# Patient Record
Sex: Male | Born: 1950 | Race: White | Hispanic: No | Marital: Married | Smoking: Never smoker
Health system: Southern US, Community
[De-identification: ages and names within clinical notes are randomized; demographics above are authoritative.]

## PROBLEM LIST (undated history)

## (undated) DIAGNOSIS — E785 Hyperlipidemia, unspecified: Secondary | ICD-10-CM

## (undated) DIAGNOSIS — I2510 Atherosclerotic heart disease of native coronary artery without angina pectoris: Secondary | ICD-10-CM

## (undated) DIAGNOSIS — N400 Enlarged prostate without lower urinary tract symptoms: Secondary | ICD-10-CM

## (undated) DIAGNOSIS — I779 Disorder of arteries and arterioles, unspecified: Secondary | ICD-10-CM

## (undated) DIAGNOSIS — G4730 Sleep apnea, unspecified: Secondary | ICD-10-CM

## (undated) DIAGNOSIS — R972 Elevated prostate specific antigen [PSA]: Secondary | ICD-10-CM

## (undated) DIAGNOSIS — K219 Gastro-esophageal reflux disease without esophagitis: Secondary | ICD-10-CM

## (undated) DIAGNOSIS — G454 Transient global amnesia: Secondary | ICD-10-CM

## (undated) DIAGNOSIS — C4339 Malignant melanoma of other parts of face: Secondary | ICD-10-CM

## (undated) DIAGNOSIS — I739 Peripheral vascular disease, unspecified: Secondary | ICD-10-CM

## (undated) HISTORY — DX: Benign prostatic hyperplasia without lower urinary tract symptoms: N40.0

## (undated) HISTORY — DX: Hyperlipidemia, unspecified: E78.5

## (undated) HISTORY — DX: Elevated prostate specific antigen (PSA): R97.20

## (undated) HISTORY — DX: Disorder of arteries and arterioles, unspecified: I77.9

## (undated) HISTORY — DX: Peripheral vascular disease, unspecified: I73.9

## (undated) HISTORY — DX: Transient global amnesia: G45.4

---

## 1998-11-28 HISTORY — PX: MELANOMA EXCISION: SHX5266

## 2004-08-11 ENCOUNTER — Ambulatory Visit: Payer: Self-pay | Admitting: Family Medicine

## 2004-08-28 ENCOUNTER — Ambulatory Visit: Payer: Self-pay | Admitting: Family Medicine

## 2004-11-25 ENCOUNTER — Ambulatory Visit: Payer: Self-pay | Admitting: Family Medicine

## 2004-12-14 ENCOUNTER — Ambulatory Visit (HOSPITAL_COMMUNITY): Admission: RE | Admit: 2004-12-14 | Discharge: 2004-12-14 | Payer: Self-pay | Admitting: Gastroenterology

## 2005-03-30 ENCOUNTER — Ambulatory Visit: Payer: Self-pay | Admitting: Family Medicine

## 2005-05-17 ENCOUNTER — Ambulatory Visit: Payer: Self-pay | Admitting: Family Medicine

## 2013-02-26 DIAGNOSIS — G454 Transient global amnesia: Secondary | ICD-10-CM

## 2013-02-26 HISTORY — DX: Transient global amnesia: G45.4

## 2013-03-12 ENCOUNTER — Observation Stay (HOSPITAL_COMMUNITY)
Admission: EM | Admit: 2013-03-12 | Discharge: 2013-03-13 | Disposition: A | Discharge status: Home / Self Care | Payer: 59 | Attending: Internal Medicine | Admitting: Internal Medicine

## 2013-03-12 ENCOUNTER — Emergency Department (HOSPITAL_COMMUNITY): Payer: 59

## 2013-03-12 ENCOUNTER — Inpatient Hospital Stay (HOSPITAL_COMMUNITY): Payer: 59

## 2013-03-12 ENCOUNTER — Encounter (HOSPITAL_COMMUNITY): Payer: Self-pay | Admitting: Radiology

## 2013-03-12 DIAGNOSIS — E785 Hyperlipidemia, unspecified: Secondary | ICD-10-CM

## 2013-03-12 DIAGNOSIS — F29 Unspecified psychosis not due to a substance or known physiological condition: Secondary | ICD-10-CM | POA: Insufficient documentation

## 2013-03-12 DIAGNOSIS — R413 Other amnesia: Secondary | ICD-10-CM

## 2013-03-12 DIAGNOSIS — F440 Dissociative amnesia: Secondary | ICD-10-CM

## 2013-03-12 DIAGNOSIS — Z7982 Long term (current) use of aspirin: Secondary | ICD-10-CM | POA: Insufficient documentation

## 2013-03-12 DIAGNOSIS — G454 Transient global amnesia: Principal | ICD-10-CM | POA: Insufficient documentation

## 2013-03-12 LAB — POCT I-STAT TROPONIN I: Troponin i, poc: 0 ng/mL (ref 0.00–0.08)

## 2013-03-12 LAB — DIFFERENTIAL
Basophils Absolute: 0 10*3/uL (ref 0.0–0.1)
Eosinophils Absolute: 0.2 10*3/uL (ref 0.0–0.7)
Eosinophils Relative: 2 % (ref 0–5)
Lymphs Abs: 1.6 10*3/uL (ref 0.7–4.0)
Neutro Abs: 5 10*3/uL (ref 1.7–7.7)

## 2013-03-12 LAB — PROTIME-INR: Prothrombin Time: 13 seconds (ref 11.6–15.2)

## 2013-03-12 LAB — COMPREHENSIVE METABOLIC PANEL
ALT: 23 U/L (ref 0–53)
Calcium: 9 mg/dL (ref 8.4–10.5)
Chloride: 103 mEq/L (ref 96–112)
GFR calc Af Amer: 87 mL/min — ABNORMAL LOW (ref >90)
GFR calc non Af Amer: 75 mL/min — ABNORMAL LOW (ref >90)
Glucose, Bld: 101 mg/dL — ABNORMAL HIGH (ref 70–99)
Potassium: 4.3 mEq/L (ref 3.5–5.1)
Sodium: 140 mEq/L (ref 135–145)
Total Bilirubin: 0.3 mg/dL (ref 0.3–1.2)
Total Protein: 7.1 g/dL (ref 6.0–8.3)

## 2013-03-12 LAB — POCT I-STAT, CHEM 8
Calcium, Ion: 1.21 mmol/L (ref 1.13–1.30)
Chloride: 104 mEq/L (ref 96–112)
Creatinine, Ser: 1.1 mg/dL (ref 0.50–1.35)
HCT: 47 % (ref 39.0–52.0)
Hemoglobin: 16 g/dL (ref 13.0–17.0)
TCO2: 28 mmol/L (ref 0–100)

## 2013-03-12 LAB — CBC
MCH: 31.9 pg (ref 26.0–34.0)
MCHC: 35.7 g/dL (ref 30.0–36.0)
MCV: 89.4 fL (ref 78.0–100.0)
Platelets: 215 10*3/uL (ref 150–400)
RDW: 12.8 % (ref 11.5–15.5)

## 2013-03-12 LAB — HEMOGLOBIN A1C: Hgb A1c MFr Bld: 5.6 % (ref <5.7)

## 2013-03-12 MED ORDER — ENOXAPARIN SODIUM 40 MG/0.4ML ~~LOC~~ SOLN
40.0000 mg | SUBCUTANEOUS | Status: DC
  Filled 2013-03-12 (×2): qty 0.4

## 2013-03-12 MED ORDER — SENNOSIDES-DOCUSATE SODIUM 8.6-50 MG PO TABS: 1.0000 | ORAL_TABLET | Freq: Every evening | ORAL | Status: DC | PRN

## 2013-03-12 MED ORDER — ASPIRIN EC 81 MG PO TBEC
81.0000 mg | DELAYED_RELEASE_TABLET | Freq: Every day | ORAL | Status: DC
  Administered 2013-03-13: 81 mg via ORAL
  Filled 2013-03-12 (×2): qty 1

## 2013-03-12 MED ORDER — SIMVASTATIN 40 MG PO TABS
40.0000 mg | ORAL_TABLET | Freq: Every day | ORAL | Status: DC
  Filled 2013-03-12 (×2): qty 1

## 2013-03-12 NOTE — ED Notes (Signed)
Debbie from Stroke Team told Lorin Picket, RN that pt did not need a Stroke Swallow screen due to being negative for Stroke symptoms.

## 2013-03-12 NOTE — ED Notes (Signed)
Pt transported to CT via stretcher by Charlena Cross RN

## 2013-03-12 NOTE — H&P (Signed)
[facility] History and Physical  JOSEPHMICHAEL LISENBEE WUJ:[id number] DOB: 1950-12-20 DOA: 03/12/2013  Referring physician: EDP PCP: Josue Hector, MD   Chief Complaint: LOSS of memory this am.   HPI: KAYLER RISE is a 62 y.o. male with h/o hyperlipidemia, reported going to work and not knowing, where he is and what he has done for the last 1 to 2 days. Family reported that he repeatedly asked questions about where he is and what he is doing, he was brought to ED as code stroke, follwoed it with CT head and MRI which was negative for stroke. He was referred to medical service for admission. Neurology consulted. He currently denies any complaints. He reports memory is improving, but still couldn't remember all the things that have happened this morning.     Review of Systems:  Constitutional:  No weight loss, night sweats, Fevers, chills, fatigue.  HEENT:  No headaches, Difficulty swallowing,Tooth/dental problems,Sore throat,  No sneezing, itching, ear ache, nasal congestion, post nasal drip,  Cardio-vascular:  No chest pain, Orthopnea, PND, swelling in lower extremities, anasarca, dizziness, palpitations  GI:  No heartburn, indigestion, abdominal pain, nausea, vomiting, diarrhea, change in bowel habits, loss of appetite  Resp:  No shortness of breath with exertion or at rest. No excess mucus, no productive cough, No non-productive cough, No coughing up of blood.No change in color of mucus.No wheezing.No chest wall deformity  Skin:  no rash or lesions.  GU:  no dysuria, change in color of urine, no urgency or frequency. No flank pain.  Musculoskeletal:  No joint pain or swelling. No decreased range of motion. No back pain.  Psych:  Recent memory loss since am.   History reviewed. No pertinent past medical history. History reviewed. No pertinent past surgical history. Social History:  reports that he has never smoked. He does not have any smokeless tobacco history on  file. His alcohol and drug histories are not on file.  No Known Allergies  Family History  Problem Relation Age of Onset  . Hypertension Mother   . Hypertension Father      Prior to Admission medications   Medication Sig Start Date End Date Taking? Authorizing Provider  aspirin EC 81 MG tablet Take 81 mg by mouth daily.   Yes Historical Provider, MD  desonide (DESOWEN) 0.05 % ointment Apply 1 application topically daily.   Yes Historical Provider, MD  finasteride (PROSCAR) 5 MG tablet Take 5 mg by mouth daily.   Yes Historical Provider, MD  Multiple Vitamin (MULTIVITAMIN WITH MINERALS) TABS tablet Take 1 tablet by mouth daily.   Yes Historical Provider, MD  pravastatin (PRAVACHOL) 80 MG tablet Take 80 mg by mouth daily.   Yes Historical Provider, MD   Physical Exam: Filed Vitals:   03/12/13 1532  BP: 140/79  Pulse: 76  Temp: 98.6 F (37 C)  Resp: 18    BP 140/79  Pulse 76  Temp(Src) 98.6 F (37 C) (Oral)  Resp 18  Wt 85.14 kg (187 lb 11.2 oz)  SpO2 97%  Constitutional: Vital signs reviewed.  Patient is a well-developed and well-nourished  in no acute distress and cooperative with exam. Alert and oriented x3.  Head: Normocephalic and atraumatic Mouth: no erythema or exudates, MMM Eyes: PERRL, EOMI, conjunctivae normal, No scleral icterus.  Neck: Supple, Trachea midline normal ROM, No JVD, mass, thyromegaly, or carotid bruit present.  Cardiovascular: RRR, S1 normal, S2 normal, no MRG, pulses symmetric and intact bilaterally Pulmonary/Chest: normal respiratory effort, CTAB, no  wheezes, rales, or rhonchi Abdominal: Soft. Non-tender, non-distended, bowel sounds are normal, no masses, organomegaly, or guarding present.  Musculoskeletal: No joint deformities, erythema, or stiffness, ROM full and no nontender Neurological: A&O x3, Strength is normal and symmetric bilaterally, cranial nerve II-XII are grossly intact, no focal motor deficit, sensory intact to light touch  bilaterally.  Skin: Warm, dry and intact. No rash, cyanosis, or clubbing.              Labs on Admission:  Basic Metabolic Panel:  Recent Labs Lab 03/12/13 1250 03/12/13 1319  NA 140 141  K 4.3 4.3  CL 103 104  CO2 25  --   GLUCOSE 101* 103*  BUN 18 22  CREATININE 1.03 1.10  CALCIUM 9.0  --    Liver Function Tests:  Recent Labs Lab 03/12/13 1250  AST 25  ALT 23  ALKPHOS 70  BILITOT 0.3  PROT 7.1  ALBUMIN 3.9   No results found for this basename: LIPASE, AMYLASE,  in the last 168 hours No results found for this basename: AMMONIA,  in the last 168 hours CBC:  Recent Labs Lab 03/12/13 1250 03/12/13 1319  WBC 7.6  --   NEUTROABS 5.0  --   HGB 16.2 16.0  HCT 45.4 47.0  MCV 89.4  --   PLT 215  --    Cardiac Enzymes:  Recent Labs Lab 03/12/13 1250  TROPONINI <0.30    BNP (last 3 results) No results found for this basename: PROBNP,  in the last 8760 hours CBG:  Recent Labs Lab 03/12/13 1304  GLUCAP 90    Radiological Exams on Admission: Dg Chest 2 View  03/12/2013   CLINICAL DATA:  Stroke.  EXAM: CHEST  2 VIEW  COMPARISON:  None.  FINDINGS: Mediastinum and hilar structures are normal. Lungs are clear of infiltrates. No pneumothorax or pleural effusion. Cardiovascular structures are unremarkable. No acute bony abnormality.  IMPRESSION: No active cardiopulmonary disease.   Electronically Signed   By: Maisie Fus  Register   On: 03/12/2013 17:34   Ct Head (brain) Wo Contrast  03/12/2013   CLINICAL DATA:  Code stroke, confusion, acute loss of memory.  EXAM: CT HEAD WITHOUT CONTRAST  TECHNIQUE: Contiguous axial images were obtained from the base of the skull through the vertex without intravenous contrast.  COMPARISON:  None.  FINDINGS: No evidence of an acute infarct, acute hemorrhage, mass lesion, mass effect or hydrocephalus. Mild atrophy. Minimal periventricular low attenuation. Visualized portions of the paranasal sinuses and mastoid air cells are clear.   IMPRESSION: 1. No acute intracranial abnormality. These results were called by telephone at the time of interpretation on 03/12/2013 at 12:48 PM to Dr. Roseanne Reno, who verbally acknowledged these results. 2. Mild atrophy.   Electronically Signed   By: Leanna Battles M.D.   On: 03/12/2013 12:49   Mr Brain Ltd W/o Cm  03/12/2013   CLINICAL DATA:  Transient global amnesia  EXAM: MRI HEAD WITHOUT CONTRAST  TECHNIQUE: Multiplanar, multiecho pulse sequences of the brain and surrounding structures were obtained without intravenous contrast.  COMPARISON:  Head CT ear earlier same day  FINDINGS: Diffusion imaging does not show any acute or subacute infarction. No evidence of brain atrophy, mass lesion, hemorrhage, hydrocephalus or extra-axial collection. No calvarial abnormality appreciated. No fluid in the visualized sinuses.  IMPRESSION: Negative diffusion imaging.  No brain abnormality evident.   Electronically Signed   By: Paulina Fusi M.D.   On: 03/12/2013 13:58    EKG: sinus rhythm  Assessment/Plan Active Problems:   Global amnesia   Amnesia, global, transient   1. Transient global amnesia/ TIA: - ADMITTED to telemetry - TIA work u p in progress.  - MRI BRAIN did not show acute stroke - EEG is normal - MRA HEAD and neck pending - echo and carotid duplex pending.  - hgba1c and lipid panel ordered.  - on aspirin 81 mg daily - neurology consulted.     Code Status: full code Family Communication: discussed in detail with pt and wife Disposition Plan: admit to inpatient  Time spent: 70 min  [facility] Pager [phone]

## 2013-03-12 NOTE — Consult Note (Signed)
Referring Physician: Denton Lank    Chief Complaint: confusion  HPI:                                                                                                                                         Jesse Brady is an 62 y.o. 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  At 1105 patient was noted to be confused, not recognizing what he was doing or what the next step was in his job.  EMS was called and on arrival he was noted to be asking very similar questions and having difficulty recalling events.  Code stroke was called and patient was brought to [facility] hospital as a Code stroke.  On arrival patient showed no localizing or lateralizing symptoms.  He did show short term memory loss and continued to ask "what was the event that brought him to hospital". HE was able to recognize family members in the room. At that time diagnosis of TGA was likely but patient was brought to MRI for limited DWI to rule out CVA.  MRI was negative for stroke and code stroke was cancled.   Date last known well: Date: 03/12/2013 Time last known well: Time: 11:05 tPA Given: No: NIHSS1  History reviewed. No pertinent past medical history.  History reviewed. No pertinent past surgical history.  Family History  Problem Relation Age of Onset  . Hypertension Mother   . Hypertension Father    Social History:  reports that he has never smoked. He does not have any smokeless tobacco history on file. His alcohol and drug histories are not on file.  Allergies: No Known Allergies  Medications:                                                                                                                           No current facility-administered medications for this encounter.   No current outpatient prescriptions on file.     ROS:  History obtained from the patient and and  family members  General ROS: negative for - chills, fatigue, fever, night sweats, weight gain or weight loss Psychological ROS: negative for - behavioral disorder, hallucinations, memory difficulties, mood swings or suicidal ideation Ophthalmic ROS: negative for - blurry vision, double vision, eye pain or loss of vision ENT ROS: negative for - epistaxis, nasal discharge, oral lesions, sore throat, tinnitus or vertigo Allergy and Immunology ROS: negative for - hives or itchy/watery eyes Hematological and Lymphatic ROS: negative for - bleeding problems, bruising or swollen lymph nodes Endocrine ROS: negative for - galactorrhea, hair pattern changes, polydipsia/polyuria or temperature intolerance Respiratory ROS: negative for - cough, hemoptysis, shortness of breath or wheezing Cardiovascular ROS: negative for - chest pain, dyspnea on exertion, edema or irregular heartbeat Gastrointestinal ROS: negative for - abdominal pain, diarrhea, hematemesis, nausea/vomiting or stool incontinence Genito-Urinary ROS: negative for - dysuria, hematuria, incontinence or urinary frequency/urgency Musculoskeletal ROS: negative for - joint swelling or muscular weakness Neurological ROS: as noted in HPI Dermatological ROS: negative for rash and skin lesion changes  Neurologic Examination:                                                                                                      Blood pressure 141/86, pulse 74, temperature 98.4 F (36.9 C), temperature source Oral, resp. rate 16, weight 85.14 kg (187 lb 11.2 oz), SpO2 97.00%.   Mental Status: Alert, oriented to present moment but will continue to ask why he was brought to hospital. Speech fluent without evidence of aphasia.  Able to follow 3 step commands without difficulty. Cranial Nerves: II: Discs flat bilaterally; Visual fields grossly normal, pupils equal, round, reactive to light and accommodation III,IV, VI: ptosis not present, extra-ocular  motions intact bilaterally V,VII: smile symmetric, facial light touch sensation normal bilaterally VIII: hearing normal bilaterally IX,X: gag reflex present XI: bilateral shoulder shrug XII: midline tongue extension without atrophy or fasciculations  Motor: Right : Upper extremity   5/5    Left:     Upper extremity   5/5  Lower extremity   5/5     Lower extremity   5/5 Tone and bulk:normal tone throughout; no atrophy noted Sensory: Pinprick and light touch intact throughout, bilaterally Deep Tendon Reflexes:  Right: Upper Extremity   Left: Upper extremity   biceps (C-5 to C-6) 2/4   biceps (C-5 to C-6) 2/4 tricep (C7) 2/4    triceps (C7) 2/4 Brachioradialis (C6) 2/4  Brachioradialis (C6) 2/4  Lower Extremity Lower Extremity  quadriceps (L-2 to L-4) 2/4   quadriceps (L-2 to L-4) 2/4 Achilles (S1) 2/4   Achilles (S1) 2/4  Plantars: Right: downgoing   Left: downgoing Cerebellar: normal finger-to-nose,  normal heel-to-shin test Gait: not examined due to acuity of event CV: pulses palpable throughout    Results for orders placed during the hospital encounter of 03/12/13 (from the past 48 hour(s))  PROTIME-INR     Status: None   Collection Time    03/12/13 12:50 PM      Result Value Range   Prothrombin  Time 13.0  11.6 - 15.2 seconds   INR 1.00  0.00 - 1.49  APTT     Status: None   Collection Time    03/12/13 12:50 PM      Result Value Range   aPTT 25  24 - 37 seconds  CBC     Status: None   Collection Time    03/12/13 12:50 PM      Result Value Range   WBC 7.6  4.0 - 10.5 K/uL   RBC 5.08  4.22 - 5.81 MIL/uL   Hemoglobin 16.2  13.0 - 17.0 g/dL   HCT 96.0  45.4 - 09.8 %   MCV 89.4  78.0 - 100.0 fL   MCH 31.9  26.0 - 34.0 pg   MCHC 35.7  30.0 - 36.0 g/dL   RDW 11.9  14.7 - 82.9 %   Platelets 215  150 - 400 K/uL  DIFFERENTIAL     Status: None   Collection Time    03/12/13 12:50 PM      Result Value Range   Neutrophils Relative % 66  43 - 77 %   Neutro Abs 5.0  1.7 -  7.7 K/uL   Lymphocytes Relative 21  12 - 46 %   Lymphs Abs 1.6  0.7 - 4.0 K/uL   Monocytes Relative 11  3 - 12 %   Monocytes Absolute 0.8  0.1 - 1.0 K/uL   Eosinophils Relative 2  0 - 5 %   Eosinophils Absolute 0.2  0.0 - 0.7 K/uL   Basophils Relative 0  0 - 1 %   Basophils Absolute 0.0  0.0 - 0.1 K/uL  COMPREHENSIVE METABOLIC PANEL     Status: Abnormal   Collection Time    03/12/13 12:50 PM      Result Value Range   Sodium 140  135 - 145 mEq/L   Potassium 4.3  3.5 - 5.1 mEq/L   Chloride 103  96 - 112 mEq/L   CO2 25  19 - 32 mEq/L   Glucose, Bld 101 (*) 70 - 99 mg/dL   BUN 18  6 - 23 mg/dL   Creatinine, Ser 5.62  0.50 - 1.35 mg/dL   Calcium 9.0  8.4 - 13.0 mg/dL   Total Protein 7.1  6.0 - 8.3 g/dL   Albumin 3.9  3.5 - 5.2 g/dL   AST 25  0 - 37 U/L   ALT 23  0 - 53 U/L   Alkaline Phosphatase 70  39 - 117 U/L   Total Bilirubin 0.3  0.3 - 1.2 mg/dL   GFR calc non Af Amer 75 (*) >90 mL/min   GFR calc Af Amer 87 (*) >90 mL/min   Comment: (NOTE)     The eGFR has been calculated using the CKD EPI equation.     This calculation has not been validated in all clinical situations.     eGFR's persistently <90 mL/min signify possible Chronic Kidney     Disease.  TROPONIN I     Status: None   Collection Time    03/12/13 12:50 PM      Result Value Range   Troponin I <0.30  <0.30 ng/mL   Comment:            Due to the release kinetics of cTnI,     a negative result within the first hours     of the onset of symptoms does not rule out     myocardial infarction  with certainty.     If myocardial infarction is still suspected,     repeat the test at appropriate intervals.  GLUCOSE, CAPILLARY     Status: None   Collection Time    03/12/13  1:04 PM      Result Value Range   Glucose-Capillary 90  70 - 99 mg/dL  POCT I-STAT TROPONIN I     Status: None   Collection Time    03/12/13  1:05 PM      Result Value Range   Troponin i, poc 0.00  0.00 - 0.08 ng/mL   Comment 3             Comment: Due to the release kinetics of cTnI,     a negative result within the first hours     of the onset of symptoms does not rule out     myocardial infarction with certainty.     If myocardial infarction is still suspected,     repeat the test at appropriate intervals.  POCT I-STAT, CHEM 8     Status: Abnormal   Collection Time    03/12/13  1:19 PM      Result Value Range   Sodium 141  135 - 145 mEq/L   Potassium 4.3  3.5 - 5.1 mEq/L   Chloride 104  96 - 112 mEq/L   BUN 22  6 - 23 mg/dL   Creatinine, Ser 1.61  0.50 - 1.35 mg/dL   Glucose, Bld 096 (*) 70 - 99 mg/dL   Calcium, Ion 0.45  4.09 - 1.30 mmol/L   TCO2 28  0 - 100 mmol/L   Hemoglobin 16.0  13.0 - 17.0 g/dL   HCT 81.1  91.4 - 78.2 %   Ct Head (brain) Wo Contrast  03/12/2013   CLINICAL DATA:  Code stroke, confusion, acute loss of memory.  EXAM: CT HEAD WITHOUT CONTRAST  TECHNIQUE: Contiguous axial images were obtained from the base of the skull through the vertex without intravenous contrast.  COMPARISON:  None.  FINDINGS: No evidence of an acute infarct, acute hemorrhage, mass lesion, mass effect or hydrocephalus. Mild atrophy. Minimal periventricular low attenuation. Visualized portions of the paranasal sinuses and mastoid air cells are clear.  IMPRESSION: 1. No acute intracranial abnormality. These results were called by telephone at the time of interpretation on 03/12/2013 at 12:48 PM to Dr. Roseanne Reno, who verbally acknowledged these results. 2. Mild atrophy.   Electronically Signed   By: Leanna Battles M.D.   On: 03/12/2013 12:49    Assessment and plan discussed with with attending physician and they are in agreement.     Stroke Risk Factors - none   Felicie Morn PA-C [facility] [phone]  03/12/2013, 2:00 PM   Assessment: 62 y.o. male presenting to hospital as a code stroke.  Given history of event, negative MRI brain and non-focal exam, likely Transient Global Amnesia but cannot exclude TIA.   Patient continues to have short term recall difficulty and will be admitted to hospital over night for observation, EEG and TIA evaluation.   Recommend: 1) EEG 2) 2-D echo, Carotid doppler, FLP, A1c, MRI/MRA head    I personally participate in this patient's evaluation and management, including examination, as well as formulating the above clinical impression and management recommendations.  Venetia Maxon M.D. [facility] [phone]

## 2013-03-12 NOTE — Procedures (Signed)
ELECTROENCEPHALOGRAM REPORT   Patient: Jesse Brady       Room #: 1O10 EEG No. ID: [id number] Age: 62 y.o.        Sex: male Referring Physician: Blake Divine Report Date:  03/12/2013        Interpreting Physician: Aline Brochure  History: [name] is an 62 y.o. male who presented with transient global amnesia earlier today. Exam was nonfocal and CT and MRI of the brain were unremarkable.  Indications for study:  Rule out encephalopathy; rule out focal seizure activity.  Technique: This is an 18 channel routine scalp EEG performed at the bedside with bipolar and monopolar montages arranged in accordance to the international 10/20 system of electrode placement.   Description: This EEG recording was performed during wakefulness. Predominant background activity consisted of 10 Hz symmetrical alpha rhythm which attenuated well with eye opening. Photic stimulation was not performed. Hyperventilation was not performed. No epileptiform discharges were recorded. There was no abnormal slowing of cerebral activity.  Interpretation: This is a normal EEG recording during wakefulness. No evidence of an encephalopathic process nor epileptic disorder demonstrated.   Venetia Maxon M.D. [facility] [phone]

## 2013-03-12 NOTE — ED Notes (Signed)
Pt was  At work and all of a sudden he could not remember what he was doing or what time it was or what had happened previously this am

## 2013-03-12 NOTE — Progress Notes (Signed)
Routine adult EEG completed. 

## 2013-03-12 NOTE — Code Documentation (Signed)
62yo male arriving to [facility] via private vehicle at 1220.  Patient cannot reveal events from this morning.  Wife reports that he was at his baseline this morning when she left for work.  Coworker reports patient appropriate and that at 1105 he noticed acute onset confusion while the patient was working on a ladder.  Patient brought to the hospital and Code Stroke called for memory loss.  CT completed.  NIHSS 1 on arrival, for missed age.  Patient continues to have difficulty recalling today's events as well as events from the past.  Repeatedly asking the same questions.  Patient to MRI to r/o stroke, MRI DWI negative per Dr. Roseanne Reno.  Patient with transient global amnesia per Dr. Roseanne Reno.  No acute stroke intervention at this time.  Code stroke canceled at 1342.  Bedside handoff with ED RN Lorin Picket.

## 2013-03-12 NOTE — ED Provider Notes (Signed)
CSN: [id number]     Arrival date & time 03/12/13  1220 History   First MD Initiated Contact with Patient 03/12/13 1240     Chief Complaint  Patient presents with  . Code Stroke   (Consider location/radiation/quality/duration/timing/severity/associated sxs/prior Treatment) The history is provided by the patient and a relative.  pt noted by coworker/family to have a global amnesia, loss of memory starting about 1100 this morning. Per report of family, pt had gotten up per normal routine, went to work, seemed normal, no confusion. At work was noted to develop confusion, repetitive questions about what they were doing, when/how, disoriented, couldn't remember his age, what had done earlier this morning, where they had been last evening. Pt states otherwise feels normal. Speech clear/fluent. No change in vision. No numbness/weakness. No problems w balance or coordination. No headache. No recent fall or head injruy   No fever or chills.     History reviewed. No pertinent past medical history. History reviewed. No pertinent past surgical history. No family history on file. History  Substance Use Topics  . Smoking status: Never Smoker   . Smokeless tobacco: Not on file  . Alcohol Use: Not on file    Review of Systems  Constitutional: Negative for fever and chills.  HENT: Negative for sore throat.   Eyes: Negative for redness.  Respiratory: Negative for cough and shortness of breath.   Cardiovascular: Negative for chest pain.  Gastrointestinal: Negative for vomiting, abdominal pain, diarrhea and constipation.  Genitourinary: Negative for flank pain.  Musculoskeletal: Negative for back pain and neck pain.  Skin: Negative for rash.  Neurological: Negative for seizures, weakness, numbness and headaches.  Hematological: Does not bruise/bleed easily.  Psychiatric/Behavioral: Positive for confusion.    Allergies  Review of patient's allergies indicates no known allergies.  Home  Medications  No current outpatient prescriptions on file. BP 141/86  Pulse 74  Temp(Src) 98.4 F (36.9 C) (Oral)  Resp 16  Wt 187 lb 11.2 oz (85.14 kg)  SpO2 97% Physical Exam  Nursing note and vitals reviewed. Constitutional: He is oriented to person, place, and time. He appears well-developed and well-nourished. No distress.  HENT:  Head: Atraumatic.  Mouth/Throat: Oropharynx is clear and moist.  Eyes: Conjunctivae and EOM are normal. Pupils are equal, round, and reactive to light.  Neck: Neck supple. No tracheal deviation present.  No bruit  Cardiovascular: Normal rate, regular rhythm, normal heart sounds and intact distal pulses.   Pulmonary/Chest: Effort normal and breath sounds normal. No accessory muscle usage. No respiratory distress.  Abdominal: Soft. Bowel sounds are normal. He exhibits no distension. There is no tenderness.  Genitourinary:  No cva tenderness  Musculoskeletal: Normal range of motion. He exhibits no edema and no tenderness.  Neurological: He is alert and oriented to person, place, and time. No cranial nerve deficit.  No pronator drift, motor intact bil. Pt oriented to person, place.  Not day. Doesn't recall age. Cannot recall what he has done this morning, ate for breakfast, did yesterday or several events from past few weeks.  Speech clear/fluent.   Skin: Skin is warm and dry.  Psychiatric: He has a normal mood and affect.    ED Course  Procedures (including critical care time)  Results for orders placed during the hospital encounter of 03/12/13  PROTIME-INR      Result Value Range   Prothrombin Time 13.0  11.6 - 15.2 seconds   INR 1.00  0.00 - 1.49  APTT  Result Value Range   aPTT 25  24 - 37 seconds  CBC      Result Value Range   WBC 7.6  4.0 - 10.5 K/uL   RBC 5.08  4.22 - 5.81 MIL/uL   Hemoglobin 16.2  13.0 - 17.0 g/dL   HCT 04.5  40.9 - 81.1 %   MCV 89.4  78.0 - 100.0 fL   MCH 31.9  26.0 - 34.0 pg   MCHC 35.7  30.0 - 36.0 g/dL   RDW  91.4  78.2 - 95.6 %   Platelets 215  150 - 400 K/uL  DIFFERENTIAL      Result Value Range   Neutrophils Relative % 66  43 - 77 %   Neutro Abs 5.0  1.7 - 7.7 K/uL   Lymphocytes Relative 21  12 - 46 %   Lymphs Abs 1.6  0.7 - 4.0 K/uL   Monocytes Relative 11  3 - 12 %   Monocytes Absolute 0.8  0.1 - 1.0 K/uL   Eosinophils Relative 2  0 - 5 %   Eosinophils Absolute 0.2  0.0 - 0.7 K/uL   Basophils Relative 0  0 - 1 %   Basophils Absolute 0.0  0.0 - 0.1 K/uL  COMPREHENSIVE METABOLIC PANEL      Result Value Range   Sodium 140  135 - 145 mEq/L   Potassium 4.3  3.5 - 5.1 mEq/L   Chloride 103  96 - 112 mEq/L   CO2 25  19 - 32 mEq/L   Glucose, Bld 101 (*) 70 - 99 mg/dL   BUN 18  6 - 23 mg/dL   Creatinine, Ser 2.13  0.50 - 1.35 mg/dL   Calcium 9.0  8.4 - 08.6 mg/dL   Total Protein 7.1  6.0 - 8.3 g/dL   Albumin 3.9  3.5 - 5.2 g/dL   AST 25  0 - 37 U/L   ALT 23  0 - 53 U/L   Alkaline Phosphatase 70  39 - 117 U/L   Total Bilirubin 0.3  0.3 - 1.2 mg/dL   GFR calc non Af Amer 75 (*) >90 mL/min   GFR calc Af Amer 87 (*) >90 mL/min  TROPONIN I      Result Value Range   Troponin I <0.30  <0.30 ng/mL  GLUCOSE, CAPILLARY      Result Value Range   Glucose-Capillary 90  70 - 99 mg/dL  POCT I-STAT TROPONIN I      Result Value Range   Troponin i, poc 0.00  0.00 - 0.08 ng/mL   Comment 3           POCT I-STAT, CHEM 8      Result Value Range   Sodium 141  135 - 145 mEq/L   Potassium 4.3  3.5 - 5.1 mEq/L   Chloride 104  96 - 112 mEq/L   BUN 22  6 - 23 mg/dL   Creatinine, Ser 5.78  0.50 - 1.35 mg/dL   Glucose, Bld 469 (*) 70 - 99 mg/dL   Calcium, Ion 6.29  5.28 - 1.30 mmol/L   TCO2 28  0 - 100 mmol/L   Hemoglobin 16.0  13.0 - 17.0 g/dL   HCT 41.3  24.4 - 01.0 %   Ct Head (brain) Wo Contrast  03/12/2013   CLINICAL DATA:  Code stroke, confusion, acute loss of memory.  EXAM: CT HEAD WITHOUT CONTRAST  TECHNIQUE: Contiguous axial images were obtained from the base of the  skull through the vertex  without intravenous contrast.  COMPARISON:  None.  FINDINGS: No evidence of an acute infarct, acute hemorrhage, mass lesion, mass effect or hydrocephalus. Mild atrophy. Minimal periventricular low attenuation. Visualized portions of the paranasal sinuses and mastoid air cells are clear.  IMPRESSION: 1. No acute intracranial abnormality. These results were called by telephone at the time of interpretation on 03/12/2013 at 12:48 PM to Dr. Roseanne Reno, who verbally acknowledged these results. 2. Mild atrophy.   Electronically Signed   By: Leanna Battles M.D.   On: 03/12/2013 12:49     EKG Interpretation    Date/Time:  Monday March 12 2013 12:50:02 EST Ventricular Rate:  74 PR Interval:  133 QRS Duration: 98 QT Interval:  390 QTC Calculation: 433 R Axis:   -31 Text Interpretation:  Sinus rhythm Non-specific ST-t changes No previous tracing Confirmed by Mattison Stuckey  MD, Cassius Cullinane ([id number]) on 03/12/2013 1:54:19 PM            MDM  Iv ns. Labs. O2. Stat ct.  Reviewed nursing notes and prior charts for additional history.   Recheck pt, no change from earlier exam.  Neurology service, Dr Roseanne Reno, requests admit to med service, they will follow/consult.      Suzi Roots, MD 03/12/13 [phone]

## 2013-03-12 NOTE — ED Notes (Signed)
Patient transported to MRI 

## 2013-03-13 DIAGNOSIS — I519 Heart disease, unspecified: Secondary | ICD-10-CM

## 2013-03-13 DIAGNOSIS — R413 Other amnesia: Secondary | ICD-10-CM | POA: Diagnosis present

## 2013-03-13 LAB — RAPID URINE DRUG SCREEN, HOSP PERFORMED
Amphetamines: NOT DETECTED
Barbiturates: NOT DETECTED
Benzodiazepines: NOT DETECTED
Cocaine: NOT DETECTED
Opiates: NOT DETECTED

## 2013-03-13 LAB — LIPID PANEL
Cholesterol: 195 mg/dL (ref 0–200)
Total CHOL/HDL Ratio: 6.1 RATIO
Triglycerides: 293 mg/dL — ABNORMAL HIGH (ref <150)
VLDL: 59 mg/dL — ABNORMAL HIGH (ref 0–40)

## 2013-03-13 MED ORDER — MICONAZOLE NITRATE 2 % EX CREA
TOPICAL_CREAM | Freq: Two times a day (BID) | CUTANEOUS | Status: DC
  Administered 2013-03-13: 13:00:00 via TOPICAL
  Filled 2013-03-13: qty 14

## 2013-03-13 MED ORDER — TOLNAFTATE 1 % EX CREA
TOPICAL_CREAM | Freq: Two times a day (BID) | CUTANEOUS | Status: DC
  Filled 2013-03-13: qty 30

## 2013-03-13 NOTE — Evaluation (Signed)
Physical Therapy Evaluation Patient Details Name: Jesse Brady MRN: [id number] DOB: 30-Sep-1950 Today's Date: 03/13/2013 Time: 0865-7846 PT Time Calculation (min): 20 min  PT Assessment / Plan / Recommendation History of Present Illness  Jesse Brady is a 62 y.o. male with h/o hyperlipidemia, reported going to work and not knowing, where he is and what he has done for the last 1 to 2 days. Family reported that he repeatedly asked questions about where he is and what he is doing, he was brought to ED as code stroke, follwoed it with CT head and MRI which was negative for stroke. He was referred to medical service for admission. Neurology consulted. He currently denies any complaints. He reports memory is improving, but still couldn't remember all the things that have happened this morning.   Clinical Impression  Pt demonstrates independence with resolution of all symptoms today. No acute PT needs, family educated on 'BEFAST" symptoms for future reference. No futher PT needs, will sign off, pt and spouse in agreement. Appreciative of services provided.    PT Assessment  Patent does not need any further PT services    Follow Up Recommendations  No PT follow up    Does the patient have the potential to tolerate intense rehabilitation      Barriers to Discharge        Equipment Recommendations  None recommended by PT    Recommendations for Other Services     Frequency      Precautions / Restrictions     Pertinent Vitals/Pain No pain      Mobility  Bed Mobility Bed Mobility: Supine to Sit;Sitting - Scoot to Edge of Bed;Sit to Supine Supine to Sit: 7: Independent Sitting - Scoot to Edge of Bed: 7: Independent Sit to Supine: 7: Independent Transfers Transfers: Sit to Stand;Stand to Sit Sit to Stand: 7: Independent Stand to Sit: 7: Independent Ambulation/Gait Ambulation/Gait Assistance: 7: Independent Ambulation Distance (Feet): 400 Feet Assistive device:  None Ambulation/Gait Assistance Details: independent    Exercises     PT Diagnosis:    PT Problem List:   PT Treatment Interventions:       PT Goals(Current goals can be found in the care plan section) Acute Rehab PT Goals PT Goal Formulation: No goals set, d/c therapy  Visit Information  Last PT Received On: 03/13/13 Assistance Needed: +1 PT/OT/SLP Co-Evaluation/Treatment: Yes Reason for Co-Treatment: Necessary to address cognition/behavior during functional activity PT goals addressed during session: Mobility/safety with mobility;Balance History of Present Illness: Jesse Brady is a 63 y.o. male with h/o hyperlipidemia, reported going to work and not knowing, where he is and what he has done for the last 1 to 2 days. Family reported that he repeatedly asked questions about where he is and what he is doing, he was brought to ED as code stroke, follwoed it with CT head and MRI which was negative for stroke. He was referred to medical service for admission. Neurology consulted. He currently denies any complaints. He reports memory is improving, but still couldn't remember all the things that have happened this morning.        Prior Functioning  Home Living Family/patient expects to be discharged to:: Private residence Living Arrangements: Spouse/significant other Available Help at Discharge: Family Type of Home: House Home Access: Stairs to enter Secretary/administrator of Steps: 3 Home Layout: One level Home Equipment: None Prior Function Level of Independence: Independent Communication Communication: No difficulties Dominant Hand: Right    Cognition  Cognition  Arousal/Alertness: Awake/alert Behavior During Therapy: WFL for tasks assessed/performed Overall Cognitive Status: Within Functional Limits for tasks assessed    Extremity/Trunk Assessment Upper Extremity Assessment Upper Extremity Assessment: Overall WFL for tasks assessed Lower Extremity Assessment Lower  Extremity Assessment: Overall WFL for tasks assessed   Balance Balance Balance Assessed: Yes Standardized Balance Assessment Standardized Balance Assessment: Dynamic Gait Index Dynamic Gait Index Level Surface: Normal Change in Gait Speed: Normal Gait with Horizontal Head Turns: Normal Gait with Vertical Head Turns: Normal Gait and Pivot Turn: Normal Step Over Obstacle: Normal Step Around Obstacles: Normal Steps: Normal Total Score: 24 High Level Balance High Level Balance Activites: Side stepping;Backward walking;Direction changes;Turns;Sudden stops;Head turns High Level Balance Comments: independent  End of Session PT - End of Session Activity Tolerance: Patient tolerated treatment well Patient left: in bed;with call bell/phone within reach;with family/visitor present Nurse Communication: Mobility status  GP     Fabio Asa 03/13/2013, 9:17 AM Charlotte Crumb, PT DPT  [phone]

## 2013-03-13 NOTE — Progress Notes (Addendum)
Discharge orders received, pt for discharge home today .  IV D/C,  D/C instructions given with verbalized understanding.  Family at bedside to assist pt with discharge. Staff brought pt downstairs via wheelchair.  

## 2013-03-13 NOTE — Evaluation (Signed)
Speech Language Pathology Evaluation Patient Details Name: Jesse Brady MRN: [id number] DOB: Sep 28, 1950 Today's Date: 03/13/2013 Time: 1440-1455 SLP Time Calculation (min): 15 min  Problem List:  Patient Active Problem List   Diagnosis Date Noted  . Amnesia 03/13/2013  . 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 Amnesia, global, transient 03/12/2013  . Other and unspecified hyperlipidemia 03/12/2013   Past Medical History: History reviewed. No pertinent past medical history. Past Surgical History: History reviewed. No pertinent past surgical history. HPI:  Jesse Brady is a 62 y.o. male with h/o hyperlipidemia, reported going to work and not knowing, where he is and what he has done for the last 1 to 2 days. Family reported that he repeatedly asked questions about where he is and what he is doing, he was brought to ED as code stroke, followed it with CT head and MRI which was negative for stroke. Patient was referred to medical service for admission; neurology consulted; patient currently denies any complaints and reports memory is improving, but still couldn't remember all the things that have happened this morning. Per MD patient's episode represents Transient Global Amnesia. This episode appears to have resolved; however, orders received and brief cognitive assessment completed.     Assessment / Plan / Recommendation Clinical Impression  Cognitive Evaluation completed.  Patient presents with intact cognitive-linguistic skills: good recall of hospital course and short-term recall of information.  Patient independently utilized an association strategy to aid him in delayed recall.  Wife present for eval and confirmed that symptoms have resolved and patient has returned to baseline.  As a result, no further skilled SLP services are warranted during this stay or following.     SLP Assessment  Patient does not need any further Speech Lanaguage Pathology Services    Follow Up Recommendations  None             Pertinent Vitals/Pain none    SLP Evaluation Prior Functioning  Cognitive/Linguistic Baseline: Within functional limits Type of Home: House  Lives With: Spouse Available Help at Discharge: Family Vocation: Full time employment   Cognition  Overall Cognitive Status: Within Functional Limits for tasks assessed Orientation Level: Oriented X4 Attention: Selective Selective Attention: Appears intact Memory: Appears intact Awareness: Appears intact Problem Solving: Appears intact Safety/Judgment: Appears intact                    GO Functional Assessment Tool Used: evaluation/assessment  Functional Limitations: Memory Memory Current Status (W0981): 0 percent impaired, limited or restricted Memory Goal Status (X9147): 0 percent impaired, limited or restricted Memory Discharge Status (W2956): 0 percent impaired, limited or restricted   Jesse Brady., CCC-SLP [phone]  Jesse Brady  03/13/2013, 4:55 PM

## 2013-03-13 NOTE — Progress Notes (Signed)
Subjective: No acute events overnight.  The patient can remember going to work yesterday morning, and can remember his MRI yesterday afternoon, but can't remember eating breakfast or coming to the hospital.  Other than of the event itself, the patient appears to have no long-term memory deficits.  Objective: Current vital signs: BP 120/73  Pulse 68  Temp(Src) 98.4 F (36.9 C) (Oral)  Resp 17  Ht 5\' 9"  (1.753 m)  Wt 180 lb 11.2 oz (81.965 kg)  BMI 26.67 kg/m2  SpO2 96%  General: alert, cooperative, and in no apparent distress HEENT: pupils equal round and reactive to light, vision grossly intact Neck: supple Lungs: normal work of respiration Heart: regular rate and rhythm, no murmurs, gallops, or rubs Abdomen: soft, non-tender, non-distended Extremities: no cyanosis, clubbing, or edema Neurologic: alert & oriented X3, cranial nerves II-XII intact, strength grossly intact, sensation intact to light touch  Medications: Scheduled Meds: . aspirin EC  81 mg Oral Daily  . enoxaparin (LOVENOX) injection  40 mg Subcutaneous Q24H  . simvastatin  40 mg Oral q1800   Continuous Infusions:  PRN Meds:.senna-docusate   Assessment/Plan: The patient's episode represents Transient Global Amnesia.  This episode appears to have resolved.  CT, MRI, EEG unremarkable.  LDL mildly high at 104, and patient notes occasional non-compliance with pravastatin (due to myalgias).  A1C wnl. -encouraged compliance with pravastatin -awaiting carotid dopplers, and 2D echo -patient can likely be discharged this afternoon, pending results of above studies  Janalyn Harder, PGY3 03/13/2013  9:27 AM   This patient was evaluated by me along with the internal medicine resident, and I concur with the above clinical assessment and management recommendations.  Venetia Maxon M.D. [facility] [phone]

## 2013-03-13 NOTE — Progress Notes (Addendum)
Echocardiogram 2D Echocardiogram has been performed.  03/13/2013 11:46 AM Gertie Fey, RVT, RDCS, RDMS

## 2013-03-13 NOTE — Discharge Summary (Signed)
Physician Discharge Summary  Jesse Brady WUJ:[id number] DOB: Aug 19, 1950 DOA: 03/12/2013  PCP: Josue Hector, MD  Admit date: 03/12/2013 Discharge date: 03/13/2013  Time spent: 35 minutes  Recommendations for Outpatient Follow-up:  1. Lesion on right leg- if not improved with fungal cream may need referral to derm for biopsy  Discharge Diagnoses:  Active Problems:   Global amnesia   Amnesia, global, transient   Other and unspecified hyperlipidemia   Discharge Condition: improved  Diet recommendation: cardiac  Filed Weights   03/12/13 1225 03/12/13 2100  Weight: 85.14 kg (187 lb 11.2 oz) 81.965 kg (180 lb 11.2 oz)    History of present illness:  Jesse Brady is a 62 y.o. male with h/o hyperlipidemia, reported going to work and not knowing, where he is and what he has done for the last 1 to 2 days. Family reported that he repeatedly asked questions about where he is and what he is doing, he was brought to ED as code stroke, follwoed it with CT head and MRI which was negative for stroke. He was referred to medical service for admission. Neurology consulted. He currently denies any complaints. He reports memory is improving, but still couldn't remember all the things that have happened this morning.    Hospital Course:  Transient Global Amnesia:  resolved. CT, MRI, EEG unremarkable. LDL mildly high at 104, and patient notes occasional non-compliance with pravastatin (due to myalgias). A1C wnl.  -encouraged compliance with pravastatin  - carotid dopplers: Bilateral: 1-39% ICA stenosis. Vertebral artery flow is antegrade  2D echo: Study Conclusions  - PLAX window was very technically difficult, therefore EF measurement may be falsely decreased. - Procedure narrative: Transthoracic echocardiography. Image quality was poor. The study was technically difficult, as a result of poor acoustic windows. - Left ventricle: The cavity size was normal. Wall thickness was  normal. Systolic function was normal. Wall motion was normal; there were no regional wall motion abnormalities. Features are consistent with a pseudonormal left ventricular filling pattern, with concomitant abnormal relaxation and increased filling pressure (grade 2 diastolic dysfunction). Doppler parameters are consistent with elevated mean left atrial filling pressure. Impressions:  - The etiology of the patient's symptoms is still not clear. No cardiac source of embolism was identified, but cannot be ruled out on the basis of this examination   Circular area with raised area on right LE- try fungal cream if not better in 1 week see PCP for referral to derm for biopsy  Procedures:  See above  Consultations:  neuro  Discharge Exam: Filed Vitals:   03/13/13 0532  BP: 120/73  Pulse: 68  Temp: 98.4 F (36.9 C)  Resp: 17    General: A+Ox3, NAD- ready to go home Cardiovascular: rrr Respiratory: clear  Discharge Instructions     Medication List    ASK your doctor about these medications       aspirin EC 81 MG tablet  Take 81 mg by mouth daily.     desonide 0.05 % ointment  Commonly known as:  DESOWEN  Apply 1 application topically daily.     finasteride 5 MG tablet  Commonly known as:  PROSCAR  Take 5 mg by mouth daily.     multivitamin with minerals Tabs tablet  Take 1 tablet by mouth daily.     pravastatin 80 MG tablet  Commonly known as:  PRAVACHOL  Take 80 mg by mouth daily.       No Known Allergies     Follow-up  Information   Follow up with Josue Hector, MD.   Specialty:  Family Medicine   Contact information:   [address] [place] [address] [phone]        The results of significant diagnostics from this hospitalization (including imaging, microbiology, ancillary and laboratory) are listed below for reference.    Significant Diagnostic Studies: Dg Chest 2 View  03/12/2013   CLINICAL DATA:  Stroke.  EXAM: CHEST  2  VIEW  COMPARISON:  None.  FINDINGS: Mediastinum and hilar structures are normal. Lungs are clear of infiltrates. No pneumothorax or pleural effusion. Cardiovascular structures are unremarkable. No acute bony abnormality.  IMPRESSION: No active cardiopulmonary disease.   Electronically Signed   By: Maisie Fus  Register   On: 03/12/2013 17:34   Ct Head (brain) Wo Contrast  03/12/2013   CLINICAL DATA:  Code stroke, confusion, acute loss of memory.  EXAM: CT HEAD WITHOUT CONTRAST  TECHNIQUE: Contiguous axial images were obtained from the base of the skull through the vertex without intravenous contrast.  COMPARISON:  None.  FINDINGS: No evidence of an acute infarct, acute hemorrhage, mass lesion, mass effect or hydrocephalus. Mild atrophy. Minimal periventricular low attenuation. Visualized portions of the paranasal sinuses and mastoid air cells are clear.  IMPRESSION: 1. No acute intracranial abnormality. These results were called by telephone at the time of interpretation on 03/12/2013 at 12:48 PM to Dr. Roseanne Reno, who verbally acknowledged these results. 2. Mild atrophy.   Electronically Signed   By: Leanna Battles M.D.   On: 03/12/2013 12:49   Mr Shirlee Latch Wo Contrast  03/12/2013   CLINICAL DATA:  62 year old male with global amnesia. Code stroke presentation. Initial encounter.  EXAM: MRI HEAD WITHOUT CONTRAST  MRA HEAD WITHOUT CONTRAST  TECHNIQUE: Multiplanar, multiecho pulse sequences of the brain and surrounding structures were obtained without intravenous contrast. Angiographic images of the head were obtained using MRA technique without contrast.  COMPARISON:  Brain diffusion imaging from 1323 hr the same day, and head CT from the same day reported separately.  FINDINGS: MRI HEAD FINDINGS  No restricted diffusion to suggest acute infarction. No midline shift, mass effect, evidence of mass lesion, ventriculomegaly, extra-axial collection or acute intracranial hemorrhage. Cervicomedullary junction and  pituitary are within normal limits. Negative visualized cervical spine. Major intracranial vascular flow voids are preserved.  Mild nonspecific subcortical white matter signal change in the left temporal lobe (series 11, image 17). Small left hippocampal fissure cyst. Otherwise negative mesial temporal lobe structures, and elsewhere normal for age gray and white matter signal.  Visualized orbit soft tissues are within normal limits. Visualized paranasal sinuses and mastoids are clear. Visible internal auditory structures appear normal. Normal bone marrow signal. Visualized scalp soft tissues are within normal limits.  MRA HEAD FINDINGS  Antegrade flow in the posterior circulation with dominant appearing distal left vertebral artery. No distal vertebral stenosis. Patent vertebrobasilar junction and left PICA origin. Dominant appearing right AICA. Patent left AICA origin. No basilar stenosis. SCA and PCA origins are normal. Posterior communicating arteries are diminutive or absent.  Antegrade flow in both ICA siphons. Mild ectasia of the cavernous segments. Ophthalmic artery origins within normal limits. Normal carotid termini, MCA and ACA origins. Diminutive anterior communicating artery. Visualized bilateral ACA and MCA branches are within normal limits.  IMPRESSION: 1. No acute intracranial abnormality. Mild for age nonspecific left temporal lobe subcortical white matter signal change. 2. Negative intracranial MRA except for mild ectasia of the ICA siphons.   Electronically Signed  By: Augusto Gamble M.D.   On: 03/12/2013 19:19   Mr Brain Wo Contrast  03/12/2013   CLINICAL DATA:  62 year old male with global amnesia. Code stroke presentation. Initial encounter.  EXAM: MRI HEAD WITHOUT CONTRAST  MRA HEAD WITHOUT CONTRAST  TECHNIQUE: Multiplanar, multiecho pulse sequences of the brain and surrounding structures were obtained without intravenous contrast. Angiographic images of the head were obtained using MRA  technique without contrast.  COMPARISON:  Brain diffusion imaging from 1323 hr the same day, and head CT from the same day reported separately.  FINDINGS: MRI HEAD FINDINGS  No restricted diffusion to suggest acute infarction. No midline shift, mass effect, evidence of mass lesion, ventriculomegaly, extra-axial collection or acute intracranial hemorrhage. Cervicomedullary junction and pituitary are within normal limits. Negative visualized cervical spine. Major intracranial vascular flow voids are preserved.  Mild nonspecific subcortical white matter signal change in the left temporal lobe (series 11, image 17). Small left hippocampal fissure cyst. Otherwise negative mesial temporal lobe structures, and elsewhere normal for age gray and white matter signal.  Visualized orbit soft tissues are within normal limits. Visualized paranasal sinuses and mastoids are clear. Visible internal auditory structures appear normal. Normal bone marrow signal. Visualized scalp soft tissues are within normal limits.  MRA HEAD FINDINGS  Antegrade flow in the posterior circulation with dominant appearing distal left vertebral artery. No distal vertebral stenosis. Patent vertebrobasilar junction and left PICA origin. Dominant appearing right AICA. Patent left AICA origin. No basilar stenosis. SCA and PCA origins are normal. Posterior communicating arteries are diminutive or absent.  Antegrade flow in both ICA siphons. Mild ectasia of the cavernous segments. Ophthalmic artery origins within normal limits. Normal carotid termini, MCA and ACA origins. Diminutive anterior communicating artery. Visualized bilateral ACA and MCA branches are within normal limits.  IMPRESSION: 1. No acute intracranial abnormality. Mild for age nonspecific left temporal lobe subcortical white matter signal change. 2. Negative intracranial MRA except for mild ectasia of the ICA siphons.   Electronically Signed   By: Augusto Gamble M.D.   On: 03/12/2013 19:19   Mr  Brain Ltd W/o Cm  03/12/2013   CLINICAL DATA:  Transient global amnesia  EXAM: MRI HEAD WITHOUT CONTRAST  TECHNIQUE: Multiplanar, multiecho pulse sequences of the brain and surrounding structures were obtained without intravenous contrast.  COMPARISON:  Head CT ear earlier same day  FINDINGS: Diffusion imaging does not show any acute or subacute infarction. No evidence of brain atrophy, mass lesion, hemorrhage, hydrocephalus or extra-axial collection. No calvarial abnormality appreciated. No fluid in the visualized sinuses.  IMPRESSION: Negative diffusion imaging.  No brain abnormality evident.   Electronically Signed   By: Paulina Fusi M.D.   On: 03/12/2013 13:58    Microbiology: No results found for this or any previous visit (from the past 240 hour(s)).   Labs: Basic Metabolic Panel:  Recent Labs Lab 03/12/13 1250 03/12/13 1319  NA 140 141  K 4.3 4.3  CL 103 104  CO2 25  --   GLUCOSE 101* 103*  BUN 18 22  CREATININE 1.03 1.10  CALCIUM 9.0  --    Liver Function Tests:  Recent Labs Lab 03/12/13 1250  AST 25  ALT 23  ALKPHOS 70  BILITOT 0.3  PROT 7.1  ALBUMIN 3.9   No results found for this basename: LIPASE, AMYLASE,  in the last 168 hours No results found for this basename: AMMONIA,  in the last 168 hours CBC:  Recent Labs Lab 03/12/13 1250 03/12/13 1319  WBC 7.6  --   NEUTROABS 5.0  --   HGB 16.2 16.0  HCT 45.4 47.0  MCV 89.4  --   PLT 215  --    Cardiac Enzymes:  Recent Labs Lab 03/12/13 1250  TROPONINI <0.30   BNP: BNP (last 3 results) No results found for this basename: PROBNP,  in the last 8760 hours CBG:  Recent Labs Lab 03/12/13 1304  GLUCAP 90       Signed:  Riely Oetken  [facility] 03/13/2013, 10:30 AM

## 2013-03-13 NOTE — Progress Notes (Signed)
   CARE MANAGEMENT NOTE 03/13/2013  Patient:  Jesse Brady, Jesse Brady   Account Number:  [id number]  Date Initiated:  03/13/2013  Documentation initiated by:  Jiles Crocker  Subjective/Objective Assessment:   ADMITTED WITH CVA     Action/Plan:   CM FOLLOWING FOR DCP   Anticipated DC Date:  03/17/2013   Anticipated DC Plan:  HOME W HOME HEALTH SERVICES VS OUTPATIENT THERAPIES; AWAITING ON PT/OT EVAL   DC Planning Services  CM consult          Status of service:  In process, will continue to follow Medicare Important Message given?  NA - LOS <3 / Initial given by admissions (If response is "NO", the following Medicare IM given date fields will be blank)  Per UR Regulation:  Reviewed for med. necessity/level of care/duration of stay  Comments:  12/16/2014Abelino Derrick RN,BSN,MHA [phone]

## 2013-03-13 NOTE — Evaluation (Signed)
Occupational Therapy Evaluation Patient Details Name: Jesse Brady MRN: [id number] DOB: 11/11/1950 Today's Date: 03/13/2013 Time: 0812-0829 OT Time Calculation (min): 17 min  OT Assessment / Plan / Recommendation History of present illness Jesse Brady is a 62 y.o. male with h/o hyperlipidemia, reported going to work and not knowing, where he is and what he has done for the last 1 to 2 days. Family reported that he repeatedly asked questions about where he is and what he is doing, he was brought to ED as code stroke, follwoed it with CT head and MRI which was negative for stroke. He was referred to medical service for admission. Neurology consulted. He currently denies any complaints. He reports memory is improving, but still couldn't remember all the things that have happened this morning.    Clinical Impression   Patient evaluated by Occupational Therapy with no further acute OT needs identified. All education has been completed and the patient has no further questions. See below for any follow-up Occupational Therapy or equipment needs. OT to sign off. Thank you for referral.      OT Assessment  Patient does not need any further OT services    Follow Up Recommendations  No OT follow up    Barriers to Discharge      Equipment Recommendations  None recommended by OT    Recommendations for Other Services    Frequency       Precautions / Restrictions Precautions Precautions: None   Pertinent Vitals/Pain none    ADL  Eating/Feeding: Independent Where Assessed - Eating/Feeding: Bed level Grooming: Wash/dry hands;Wash/dry face;Teeth care;Independent Where Assessed - Grooming: Unsupported standing Toilet Transfer: Community education officer Method: Sit to Barista: Regular height toilet Transfers/Ambulation Related to ADLs: Pt ambulating at baseline without deficits ADL Comments: pt is baseline and no deficits noted    OT Diagnosis:    OT Problem  List:   OT Treatment Interventions:     OT Goals(Current goals can be found in the care plan section)    Visit Information  Last OT Received On: 03/13/13 Assistance Needed: +1 PT/OT/SLP Co-Evaluation/Treatment: Yes Reason for Co-Treatment: Necessary to address cognition/behavior during functional activity PT goals addressed during session: Mobility/safety with mobility;Balance OT goals addressed during session: ADL's and self-care History of Present Illness: Jesse Brady is a 62 y.o. male with h/o hyperlipidemia, reported going to work and not knowing, where he is and what he has done for the last 1 to 2 days. Family reported that he repeatedly asked questions about where he is and what he is doing, he was brought to ED as code stroke, follwoed it with CT head and MRI which was negative for stroke. He was referred to medical service for admission. Neurology consulted. He currently denies any complaints. He reports memory is improving, but still couldn't remember all the things that have happened this morning.        Prior Functioning     Home Living Family/patient expects to be discharged to:: Private residence Living Arrangements: Spouse/significant other Available Help at Discharge: Family Type of Home: House Home Access: Stairs to enter Secretary/administrator of Steps: 3 Home Layout: One level Home Equipment: None Prior Function Level of Independence: Independent Communication Communication: No difficulties Dominant Hand: Right         Vision/Perception Vision - History Baseline Vision: Wears contacts Patient Visual Report: No change from baseline Vision - Assessment Eye Alignment: Within Functional Limits Vision Assessment: Vision tested Ocular Range of  Motion: Within Functional Limits Tracking/Visual Pursuits: Able to track stimulus in all quads without difficulty   Cognition  Cognition Arousal/Alertness: Awake/alert Behavior During Therapy: WFL for tasks  assessed/performed Overall Cognitive Status: Within Functional Limits for tasks assessed    Extremity/Trunk Assessment Upper Extremity Assessment Upper Extremity Assessment: Overall WFL for tasks assessed Lower Extremity Assessment Lower Extremity Assessment: Defer to PT evaluation Cervical / Trunk Assessment Cervical / Trunk Assessment: Normal     Mobility Bed Mobility Bed Mobility: Supine to Sit;Sitting - Scoot to [place] of Bed;Sit to Supine Supine to Sit: 7: Independent Sitting - Scoot to Edge of Bed: 7: Independent Sit to Supine: 7: Independent Transfers Sit to Stand: 7: Independent Stand to Sit: 7: Independent     Exercise     Balance Balance Balance Assessed: Yes Standardized Balance Assessment Standardized Balance Assessment: Dynamic Gait Index Dynamic Gait Index Level Surface: Normal Change in Gait Speed: Normal Gait with Horizontal Head Turns: Normal Gait with Vertical Head Turns: Normal Gait and Pivot Turn: Normal Step Over Obstacle: Normal Step Around Obstacles: Normal Steps: Normal Total Score: 24 High Level Balance High Level Balance Activites: Backward walking;Direction changes;Turns High Level Balance Comments: independent   End of Session OT - End of Session Activity Tolerance: Patient tolerated treatment well Patient left: in bed;with call bell/phone within reach;with family/visitor present  GO     Boone Master B 03/13/2013, 9:45 AM Pager: [phone]

## 2013-03-13 NOTE — Progress Notes (Signed)
*  PRELIMINARY RESULTS* Vascular Ultrasound Carotid Duplex (Doppler) has been completed.  Preliminary findings: Bilateral:  1-39% ICA stenosis.  Vertebral artery flow is antegrade.      Farrel Demark, RDMS, RVT  03/13/2013, 9:42 AM

## 2014-02-28 ENCOUNTER — Encounter: Payer: Self-pay | Admitting: Cardiology

## 2014-02-28 ENCOUNTER — Ambulatory Visit (INDEPENDENT_AMBULATORY_CARE_PROVIDER_SITE_OTHER): Payer: Medicare Other | Admitting: Cardiology

## 2014-02-28 VITALS — BP 142/84 | HR 65 | Ht 69.0 in | Wt 182.0 lb

## 2014-02-28 DIAGNOSIS — I739 Peripheral vascular disease, unspecified: Secondary | ICD-10-CM

## 2014-02-28 DIAGNOSIS — R0789 Other chest pain: Secondary | ICD-10-CM

## 2014-02-28 DIAGNOSIS — I779 Disorder of arteries and arterioles, unspecified: Secondary | ICD-10-CM

## 2014-02-28 DIAGNOSIS — R072 Precordial pain: Secondary | ICD-10-CM

## 2014-02-28 DIAGNOSIS — E785 Hyperlipidemia, unspecified: Secondary | ICD-10-CM

## 2014-02-28 NOTE — Progress Notes (Signed)
Reason for visit: Chest pain  Clinical Summary Jesse Brady is a 63 y.o.male referred for cardiology consultation by Dr. Edrick Oh. He reports a mild feeling of chest tightness with certain exertional activities, this has been present intermittently in the last several weeks. Also associated with "deep" discomfort in his right upper arm, sometimes left upper arm. Activity such as carrying a weight across his yard or walking up steps can elicit the symptoms. Also has felt a more generalized "soreness" in his chest depending on which position he sleeps in at nighttime. This is not exertional. He reports having a treadmill test approximately 20 years ago, no known history of obstructive CAD or myocardial infarction. ECG in the office today shows normal sinus rhythm. At baseline he has NYHA class II dyspnea.  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 He was exercising regularly a few years ago, but has not at all been consistent recently.  Labwork from December 2014 showed cholesterol 195, triglycerides 293, HDL 32, LDL 104. He reports compliance with Pravachol.  No known history of hypertension or type 2 diabetes mellitus.  Echocardiogram from December 2014 was technically difficult with limited images, grossly normal LV systolic function, grade 2 diastolic dysfunction. I see that he also had carotid Dopplers in December 2014 that showed mild bilateral ICA atherosclerosis.  No Known Allergies  Current Outpatient Prescriptions  Medication Sig Dispense Refill  . aspirin EC 81 MG tablet Take 81 mg by mouth daily.    . finasteride (PROSCAR) 5 MG tablet Take 5 mg by mouth daily.    . Multiple Vitamin (MULTIVITAMIN WITH MINERALS) TABS tablet Take 1 tablet by mouth daily.    . pravastatin (PRAVACHOL) 80 MG tablet Take 80 mg by mouth daily.     No current facility-administered medications for this visit.    Past Medical History   Diagnosis Date  . Hyperlipidemia   . Prostatic hypertrophy   . Elevated PSA     Biopsies negative 2009   . 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 Carotid artery disease     Carotid Dopplers December 2014 with 1-39% bilateral ICA stenoses    History reviewed. No pertinent past surgical history.  Family History  Problem Relation Age of Onset  . Hypertension Mother   . Hypertension Father   . CAD Father     Premature disease  . AAA (abdominal aortic aneurysm) Father     Died in his 77s    Social History [name] reports that he has never smoked. He does not have any smokeless tobacco history on file. [name] reports that he does not drink alcohol.  Review of Systems Complete review of systems negative except as otherwise outlined in the clinical summary and also the following. No palpitations or syncope. No claudication. No orthopnea or PND. No abdominal pain.  Physical Examination Filed Vitals:   02/28/14 0837  BP: 142/84  Pulse: 65   Filed Weights   02/28/14 0837  Weight: 182 lb (82.555 kg)   Patient appears comfortable at rest. HEENT: Conjunctiva and lids normal, oropharynx clear. Neck: Supple, no elevated JVP or carotid bruits, no thyromegaly. Lungs: Clear to auscultation, nonlabored breathing at rest. Cardiac: Regular rate and rhythm, no S3 or significant systolic murmur, no pericardial rub. Abdomen: Soft, nontender, no bruits, bowel sounds present, no guarding or rebound. Extremities: No pitting edema, distal pulses 2+. Skin: Warm and dry. Musculoskeletal: No kyphosis.  Neuropsychiatric: Alert and oriented x3, affect grossly appropriate.   Problem List and Plan   Precordial pain Symptoms are suggestive of stable angina. Baseline ECG normal, principle cardiac risk factors including age and gender, family history of early CAD in his father, prior documentation of mild carotid atherosclerosis, also hyperlipidemia. Blood pressure mildly elevated today  but no standing history of hypertension. Plan is to proceed with an exercise Cardiolite for objective ischemic evaluation.  Hyperlipidemia LDL 104 as of last year, reports compliance with Pravachol.  Carotid artery disease Asymptomatic, mild by carotid Dopplers in December 2014. He continues on aspirin and statin.    Satira Sark, M.D., F.A.C.C.

## 2014-02-28 NOTE — Assessment & Plan Note (Signed)
LDL 104 as of last year, reports compliance with Pravachol.

## 2014-02-28 NOTE — Assessment & Plan Note (Addendum)
Symptoms are suggestive of stable angina. Baseline ECG normal, principle cardiac risk factors including age and gender, family history of early CAD in his father, prior documentation of mild carotid atherosclerosis, also hyperlipidemia. Blood pressure mildly elevated today but no standing history of hypertension. Plan is to proceed with an exercise Cardiolite for objective ischemic evaluation.

## 2014-02-28 NOTE — Assessment & Plan Note (Signed)
Asymptomatic, mild by carotid Dopplers in December 2014. He continues on aspirin and statin.

## 2014-02-28 NOTE — Patient Instructions (Signed)
Your physician recommends that you schedule a follow-up appointment in: to be determined after your tests,we will call you with results    Your physician recommends that you continue on your current medications as directed. Please refer to the Current Medication list given to you today.      Your physician has requested that you have en exercise stress myoview. For further information please visit [email or web address]. Please follow instruction sheet, as given.      Thank you for choosing [name] !

## 2014-03-08 ENCOUNTER — Encounter (HOSPITAL_COMMUNITY): Payer: Medicare Other

## 2014-04-05 ENCOUNTER — Encounter (HOSPITAL_COMMUNITY)
Admission: RE | Admit: 2014-04-05 | Discharge: 2014-04-05 | Disposition: A | Discharge status: Home / Self Care | Payer: Medicare Other | Source: Ambulatory Visit | Attending: Cardiology | Admitting: Cardiology

## 2014-04-05 ENCOUNTER — Ambulatory Visit (HOSPITAL_COMMUNITY)
Admission: RE | Admit: 2014-04-05 | Discharge: 2014-04-05 | Disposition: A | Discharge status: Home / Self Care | Payer: Medicare Other | Source: Ambulatory Visit | Attending: Cardiology | Admitting: Cardiology

## 2014-04-05 ENCOUNTER — Encounter (HOSPITAL_COMMUNITY): Payer: Self-pay

## 2014-04-05 DIAGNOSIS — R072 Precordial pain: Secondary | ICD-10-CM

## 2014-04-05 DIAGNOSIS — R079 Chest pain, unspecified: Secondary | ICD-10-CM | POA: Insufficient documentation

## 2014-04-05 DIAGNOSIS — R9439 Abnormal result of other cardiovascular function study: Secondary | ICD-10-CM | POA: Insufficient documentation

## 2014-04-05 DIAGNOSIS — I259 Chronic ischemic heart disease, unspecified: Secondary | ICD-10-CM | POA: Diagnosis not present

## 2014-04-05 MED ORDER — TECHNETIUM TC 99M SESTAMIBI - CARDIOLITE
30.0000 | Freq: Once | INTRAVENOUS | Status: AC | PRN
  Administered 2014-04-05: 30 via INTRAVENOUS

## 2014-04-05 MED ORDER — SODIUM CHLORIDE 0.9 % IJ SOLN
INTRAMUSCULAR | Status: AC
  Administered 2014-04-05: 12:00:00 10 mL via INTRAVENOUS
  Filled 2014-04-05: qty 3

## 2014-04-05 MED ORDER — TECHNETIUM TC 99M SESTAMIBI GENERIC - CARDIOLITE
10.0000 | Freq: Once | INTRAVENOUS | Status: AC | PRN
  Administered 2014-04-05: 10 via INTRAVENOUS

## 2014-04-05 MED ORDER — SODIUM CHLORIDE 0.9 % IJ SOLN
10.0000 mL | INTRAMUSCULAR | Status: DC | PRN
  Administered 2014-04-05: 10 mL via INTRAVENOUS
  Filled 2014-04-05: qty 10

## 2014-04-05 NOTE — Progress Notes (Signed)
Stress Lab Nurses Notes - [facility]ine Na  Bernon Arviso Seaberry 04/05/2014 Reason for doing test: Chest Pain Type of test: Stress Myoview Nurse performing test: Gerrit Halls, RN Nuclear Medicine Tech: Redmond Baseman Echo Tech: Not Applicable MD performing test: Branch/K.Purcell Nails NP Family MD: Dr. Edrick Oh Test explained and consent signed: Yes.   IV started: Saline lock flushed, No redness or edema and Saline lock started in radiology Symptoms: Chest discomfort & bilateral upper arm discomfort. Treatment/Intervention: None Reason test stopped: fatigue After recovery IV was: Discontinued via X-ray tech and No redness or edema Patient to return to Nuc. Med at : 12:15 Patient discharged: Home Patient's Condition upon discharge was: stable Comments: During test peak BP 175/85 & HR 130.  Recovery BP 138/84 & HR 88.  Symptoms resolved in recovery. Geanie Cooley T

## 2014-04-08 ENCOUNTER — Ambulatory Visit: Payer: Medicare Other | Admitting: Cardiology

## 2014-04-10 ENCOUNTER — Encounter: Payer: Self-pay | Admitting: Physician Assistant

## 2014-04-10 ENCOUNTER — Ambulatory Visit (INDEPENDENT_AMBULATORY_CARE_PROVIDER_SITE_OTHER): Payer: Medicare Other | Admitting: Physician Assistant

## 2014-04-10 ENCOUNTER — Encounter: Payer: Self-pay | Admitting: *Deleted

## 2014-04-10 ENCOUNTER — Other Ambulatory Visit: Payer: Self-pay | Admitting: Physician Assistant

## 2014-04-10 ENCOUNTER — Ambulatory Visit (HOSPITAL_COMMUNITY)
Admission: RE | Admit: 2014-04-10 | Discharge: 2014-04-10 | Disposition: A | Discharge status: Home / Self Care | Payer: Medicare Other | Source: Ambulatory Visit | Attending: Physician Assistant | Admitting: Physician Assistant

## 2014-04-10 VITALS — BP 132/82 | HR 80 | Ht 68.0 in | Wt 184.0 lb

## 2014-04-10 DIAGNOSIS — I779 Disorder of arteries and arterioles, unspecified: Secondary | ICD-10-CM

## 2014-04-10 DIAGNOSIS — Z01818 Encounter for other preprocedural examination: Secondary | ICD-10-CM | POA: Diagnosis not present

## 2014-04-10 DIAGNOSIS — R079 Chest pain, unspecified: Secondary | ICD-10-CM | POA: Insufficient documentation

## 2014-04-10 DIAGNOSIS — R0602 Shortness of breath: Secondary | ICD-10-CM | POA: Diagnosis not present

## 2014-04-10 DIAGNOSIS — R072 Precordial pain: Secondary | ICD-10-CM

## 2014-04-10 DIAGNOSIS — I739 Peripheral vascular disease, unspecified: Secondary | ICD-10-CM

## 2014-04-10 DIAGNOSIS — E785 Hyperlipidemia, unspecified: Secondary | ICD-10-CM

## 2014-04-10 NOTE — Patient Instructions (Addendum)
Your physician has requested that you have a cardiac catheterization. Cardiac catheterization is used to diagnose and/or treat various heart conditions. Doctors may recommend this procedure for a number of different reasons. The most common reason is to evaluate chest pain. Chest pain can be a symptom of coronary artery disease (CAD), and cardiac catheterization can show whether plaque is narrowing or blocking your heart's arteries. This procedure is also used to evaluate the valves, as well as measure the blood flow and oxygen levels in different parts of your heart. For further information please visit [email or web address]. Please follow instruction sheet, as given.  Your physician recommends that you return for lab work Today (CMET, CBC, PT/PTT)  Your physician recommends that you continue on your current medications as directed. Please refer to the Current Medication list given to you today.  Have chest x-ray done today.   NO heavy lifting    Thank you for choosing [facility]!

## 2014-04-10 NOTE — Assessment & Plan Note (Signed)
Lipid panel recently was stable on Pravachol.

## 2014-04-10 NOTE — Progress Notes (Signed)
Primary physician Dr. Edrick Oh Cardiologist's Dr. Domenic Polite   HPI: This is a 64 year old male patient Dr. Domenic Polite saw on 02/28/14 for exertional chest pain. He takes Pravachol for hyperlipidemia and has no history of hypertension or diabetes mellitus. Echo in December 2014 was technically difficult but showed grossly normal LV systolic function with grade 2 diastolic discussion. Carotid Dopplers in December 2014 showed mild bilateral ICA atherosclerosis.  Stress Myoview on 04/05/14 showed a large area of inferior, inferoseptal, and inferoapical ischemia. He also had mild inferior and septal wall hypokinesis EF 60%. The patient did experience nonlimiting chest pain during the stress portion and developed 2 mm ST depression in the inferior leads. This was felt to be high risk stress test finding and he is here today to be scheduled for cardiac catheterization.  No Known Allergies   Current Outpatient Prescriptions  Medication Sig Dispense Refill  . aspirin EC 81 MG tablet Take 81 mg by mouth daily.    . finasteride (PROSCAR) 5 MG tablet Take 5 mg by mouth daily.    . Multiple Vitamin (MULTIVITAMIN WITH MINERALS) TABS tablet Take 1 tablet by mouth daily.    . pravastatin (PRAVACHOL) 80 MG tablet Take 80 mg by mouth daily.     No current facility-administered medications for this visit.    Past Medical History  Diagnosis Date  . Hyperlipidemia   . Prostatic hypertrophy   . Elevated PSA     Biopsies negative 2009   . 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 Carotid artery disease     Carotid Dopplers December 2014 with 1-39% bilateral ICA stenoses    No past surgical history on file.  Family History  Problem Relation Age of Onset  . Hypertension Mother   . Hypertension Father   . CAD Father     Premature disease  . AAA (abdominal aortic aneurysm) Father     Died in his 2s    History   Social History  . Marital Status: Married    Spouse Name: N/A    Number of  Children: N/A  . Years of Education: N/A   Occupational History  . Not on file.   Social History Main Topics  . Smoking status: Never Smoker   . Smokeless tobacco: Not on file  . Alcohol Use: No  . Drug Use: No  . Sexual Activity: Not on file   Other Topics Concern  . Not on file   Social History Narrative    ROS: See HPI Eyes: Negative Ears:Negative for hearing loss, tinnitus Cardiovascular: Negative for palpitations,irregular heartbeat, dyspnea, near-syncope, orthopnea, paroxysmal nocturnal dyspnea and syncope,edema, claudication, cyanosis,.  Respiratory:   Negative for cough, hemoptysis, shortness of breath, sleep disturbances due to breathing, sputum production and wheezing.   Endocrine: Negative for cold intolerance and heat intolerance.  Hematologic/Lymphatic: Negative for adenopathy and bleeding problem. Does not bruise/bleed easily.  Musculoskeletal: Negative.   Gastrointestinal: Negative for nausea, vomiting, reflux, abdominal pain, diarrhea, constipation.   Genitourinary: Negative for bladder incontinence, dysuria, flank pain, frequency, hematuria, hesitancy, nocturia and urgency.  Neurological: Negative.  Allergic/Immunologic: Negative for environmental allergies.   BP 132/82 mmHg  Pulse 80  Ht 5\' 8"  (1.727 m)  Wt 184 lb (83.462 kg)  BMI 27.98 kg/m2  PHYSICAL EXAM: Well-nournished, in no acute distress. Neck: No JVD, HJR, Bruit, or thyroid enlargement  Lungs: No tachypnea, clear without wheezing, rales, or rhonchi  Cardiovascular: RRR, PMI not displaced, Normal S1 and S2, positive S4, no murmurs,  bruit, thrill, or heave.  Abdomen: BS normal. Soft without organomegaly, masses, lesions or tenderness.  Extremities: without cyanosis, clubbing or edema. Good distal pulses bilateral  SKin: Warm, no lesions or rashes   Musculoskeletal: No deformities  Neuro: no focal signs   Wt Readings from Last 3 Encounters:  02/28/14 182 lb (82.555 kg)  03/12/13 180  lb 11.2 oz (81.965 kg)    Lab Results  Component Value Date   WBC 7.6 03/12/2013   HGB 16.0 03/12/2013   HCT 47.0 03/12/2013   PLT 215 03/12/2013   GLUCOSE 103* 03/12/2013   CHOL 195 03/12/2013   TRIG 293* 03/12/2013   HDL 32* 03/12/2013   LDLCALC 104* 03/12/2013   ALT 23 03/12/2013   AST 25 03/12/2013   NA 141 03/12/2013   K 4.3 03/12/2013   CL 104 03/12/2013   CREATININE 1.10 03/12/2013   BUN 22 03/12/2013   CO2 25 03/12/2013   INR 1.00 03/12/2013   HGBA1C 5.7* 03/12/2013    EKG: Not performed, just had a stress test.  IMPRESSION:  Stress myoview 04/05/13: 1. Large area of inferior, inferoseptal, inferoapical ischemia.   2. There is mild inferior and septal wall hypokinesis.   3. Left ventricular ejection fraction 60%   4. Abnormal stress EKG findings with evidence of ischemia. The patient did experience non limiting chest pain. Duke treadmill score of -5, consistent with moderate risk for major cardiac events.   5. High-risk stress test findings*. High risk findings due to a large area of myocardium at jeopardy.   *2012 Appropriate Use Criteria for Coronary Revascularization Focused Update: J Am Coll Cardiol. 0093;81(8):299-371. http://content.airportbarriers.com.aspx?articleid=1201161     Electronically Signed   By: Carlyle Dolly   On: 04/05/2014 13:09 Summary:  - The vertebral arteries appear patent with antegrade flow. - Findings consistent with 1- 39 percent stenosis involving   the right internal carotid artery and the left internal   carotid artery. - . Other specific details can be found in the table(s) above.    Prepared and Electronically Authenticated by

## 2014-04-10 NOTE — Addendum Note (Signed)
Addended by: Levonne Hubert on: 04/10/2014 01:29 PM   Modules accepted: Orders

## 2014-04-10 NOTE — Assessment & Plan Note (Addendum)
Patient has history of chest pain and now has high risk stress test finding with large area of inferior, inferoseptal and inferior apical ischemia. There was mild inferior and septal wall hypokinesis, EF 60%. We'll schedule for left heart catheterization and possible PCI. Have explained the risks and benefits to the patient including bleeding MI and CVA. Patient is agreeable to proceed. Patient is scheduled for this Friday 04/12/14 at 7:30 AM with Dr.Varanasi. He is advised to do no heavy lifting or strenuous activity prior to cath. Proceed to the emergency room for any prolonged chest pain.

## 2014-04-10 NOTE — Assessment & Plan Note (Signed)
Carotid Dopplers on 04/05/14 1-39% bilateral ICA.

## 2014-04-11 LAB — CBC WITH DIFFERENTIAL/PLATELET
BASOS PCT: 0 % (ref 0–1)
Basophils Absolute: 0 10*3/uL (ref 0.0–0.1)
EOS ABS: 0.2 10*3/uL (ref 0.0–0.7)
Eosinophils Relative: 2 % (ref 0–5)
HCT: 46.5 % (ref 39.0–52.0)
HEMOGLOBIN: 16.3 g/dL (ref 13.0–17.0)
Lymphocytes Relative: 21 % (ref 12–46)
Lymphs Abs: 1.6 10*3/uL (ref 0.7–4.0)
MCH: 31 pg (ref 26.0–34.0)
MCHC: 35.1 g/dL (ref 30.0–36.0)
MCV: 88.4 fL (ref 78.0–100.0)
MPV: 9.2 fL (ref 8.6–12.4)
Monocytes Absolute: 0.6 10*3/uL (ref 0.1–1.0)
Monocytes Relative: 8 % (ref 3–12)
Neutro Abs: 5.3 10*3/uL (ref 1.7–7.7)
Neutrophils Relative %: 69 % (ref 43–77)
Platelets: 250 10*3/uL (ref 150–400)
RBC: 5.26 MIL/uL (ref 4.22–5.81)
RDW: 13.5 % (ref 11.5–15.5)
WBC: 7.7 10*3/uL (ref 4.0–10.5)

## 2014-04-11 LAB — PROTIME-INR
INR: 0.99 (ref <1.50)
Prothrombin Time: 13.1 seconds (ref 11.6–15.2)

## 2014-04-11 LAB — COMPREHENSIVE METABOLIC PANEL
ALBUMIN: 4.3 g/dL (ref 3.5–5.2)
ALK PHOS: 60 U/L (ref 39–117)
ALT: 26 U/L (ref 0–53)
AST: 23 U/L (ref 0–37)
BUN: 23 mg/dL (ref 6–23)
CHLORIDE: 102 meq/L (ref 96–112)
CO2: 25 meq/L (ref 19–32)
CREATININE: 1.04 mg/dL (ref 0.50–1.35)
Calcium: 9.4 mg/dL (ref 8.4–10.5)
GLUCOSE: 84 mg/dL (ref 70–99)
Potassium: 4.3 mEq/L (ref 3.5–5.3)
Sodium: 136 mEq/L (ref 135–145)
TOTAL PROTEIN: 7.3 g/dL (ref 6.0–8.3)
Total Bilirubin: 0.7 mg/dL (ref 0.2–1.2)

## 2014-04-11 LAB — APTT: aPTT: 26 seconds (ref 24–37)

## 2014-04-12 ENCOUNTER — Encounter (HOSPITAL_COMMUNITY): Payer: Self-pay | Admitting: General Practice

## 2014-04-12 ENCOUNTER — Ambulatory Visit (HOSPITAL_COMMUNITY)
Admission: RE | Admit: 2014-04-12 | Discharge: 2014-04-13 | Disposition: A | Discharge status: Home / Self Care | Payer: Medicare Other | Source: Ambulatory Visit | Attending: Interventional Cardiology | Admitting: Interventional Cardiology

## 2014-04-12 ENCOUNTER — Encounter (HOSPITAL_COMMUNITY)
Admission: RE | Disposition: A | Discharge status: Home / Self Care | Payer: Medicare Other | Source: Ambulatory Visit | Attending: Interventional Cardiology

## 2014-04-12 DIAGNOSIS — R9439 Abnormal result of other cardiovascular function study: Secondary | ICD-10-CM | POA: Diagnosis present

## 2014-04-12 DIAGNOSIS — Z7982 Long term (current) use of aspirin: Secondary | ICD-10-CM | POA: Diagnosis not present

## 2014-04-12 DIAGNOSIS — I2510 Atherosclerotic heart disease of native coronary artery without angina pectoris: Secondary | ICD-10-CM

## 2014-04-12 DIAGNOSIS — Z79899 Other long term (current) drug therapy: Secondary | ICD-10-CM | POA: Insufficient documentation

## 2014-04-12 DIAGNOSIS — N400 Enlarged prostate without lower urinary tract symptoms: Secondary | ICD-10-CM | POA: Diagnosis not present

## 2014-04-12 DIAGNOSIS — R079 Chest pain, unspecified: Secondary | ICD-10-CM

## 2014-04-12 DIAGNOSIS — E785 Hyperlipidemia, unspecified: Secondary | ICD-10-CM | POA: Diagnosis present

## 2014-04-12 HISTORY — PX: LEFT HEART CATHETERIZATION WITH CORONARY ANGIOGRAM: SHX5451

## 2014-04-12 HISTORY — DX: Atherosclerotic heart disease of native coronary artery without angina pectoris: I25.10

## 2014-04-12 HISTORY — DX: Malignant melanoma of other parts of face: C43.39

## 2014-04-12 HISTORY — DX: Sleep apnea, unspecified: G47.30

## 2014-04-12 HISTORY — DX: Gastro-esophageal reflux disease without esophagitis: K21.9

## 2014-04-12 LAB — POCT ACTIVATED CLOTTING TIME
ACTIVATED CLOTTING TIME: 361 s
Activated Clotting Time: 263 seconds

## 2014-04-12 LAB — PLATELET COUNT: Platelets: 199 10*3/uL (ref 150–400)

## 2014-04-12 SURGERY — LEFT HEART CATHETERIZATION WITH CORONARY ANGIOGRAM

## 2014-04-12 MED ORDER — CLOPIDOGREL BISULFATE 300 MG PO TABS
ORAL_TABLET | ORAL | Status: AC
  Filled 2014-04-12: qty 1

## 2014-04-12 MED ORDER — CLOPIDOGREL BISULFATE 75 MG PO TABS
75.0000 mg | ORAL_TABLET | Freq: Every day | ORAL | Status: DC
  Administered 2014-04-13: 75 mg via ORAL
  Filled 2014-04-12: qty 1

## 2014-04-12 MED ORDER — MIDAZOLAM HCL 2 MG/2ML IJ SOLN
INTRAMUSCULAR | Status: AC
  Filled 2014-04-12: qty 2

## 2014-04-12 MED ORDER — SODIUM CHLORIDE 0.9 % IJ SOLN: 3.0000 mL | INTRAMUSCULAR | Status: DC | PRN

## 2014-04-12 MED ORDER — ASPIRIN EC 81 MG PO TBEC
81.0000 mg | DELAYED_RELEASE_TABLET | Freq: Every day | ORAL | Status: DC
  Administered 2014-04-13: 81 mg via ORAL
  Filled 2014-04-12 (×2): qty 1

## 2014-04-12 MED ORDER — SODIUM CHLORIDE 0.9 % IV SOLN: 1.0000 mL/kg/h | INTRAVENOUS | Status: AC

## 2014-04-12 MED ORDER — LIDOCAINE HCL (PF) 1 % IJ SOLN
INTRAMUSCULAR | Status: AC
  Filled 2014-04-12: qty 30

## 2014-04-12 MED ORDER — FENTANYL CITRATE 0.05 MG/ML IJ SOLN
INTRAMUSCULAR | Status: AC
  Filled 2014-04-12: qty 2

## 2014-04-12 MED ORDER — ACETAMINOPHEN 325 MG PO TABS: 650.0000 mg | ORAL_TABLET | ORAL | Status: DC | PRN

## 2014-04-12 MED ORDER — ASPIRIN 81 MG PO CHEW
CHEWABLE_TABLET | ORAL | Status: AC
  Administered 2014-04-12: 81 mg
  Filled 2014-04-12: qty 1

## 2014-04-12 MED ORDER — HEPARIN (PORCINE) IN NACL 2-0.9 UNIT/ML-% IJ SOLN
INTRAMUSCULAR | Status: AC
  Filled 2014-04-12: qty 1000

## 2014-04-12 MED ORDER — ONDANSETRON HCL 4 MG/2ML IJ SOLN: 4.0000 mg | Freq: Four times a day (QID) | INTRAMUSCULAR | Status: DC | PRN

## 2014-04-12 MED ORDER — SODIUM CHLORIDE 0.9 % IV SOLN
INTRAVENOUS | Status: DC
  Administered 2014-04-12: 07:00:00 via INTRAVENOUS

## 2014-04-12 MED ORDER — TIROFIBAN HCL IV 5 MG/100ML
0.1500 ug/kg/min | INTRAVENOUS | Status: AC
  Filled 2014-04-12: qty 100

## 2014-04-12 MED ORDER — NITROGLYCERIN 1 MG/10 ML FOR IR/CATH LAB
INTRA_ARTERIAL | Status: AC
  Filled 2014-04-12: qty 10

## 2014-04-12 MED ORDER — ACTIVE PARTNERSHIP FOR HEALTH OF YOUR HEART BOOK
Freq: Once | Status: AC
  Administered 2014-04-12: 22:00:00
  Filled 2014-04-12: qty 1

## 2014-04-12 MED ORDER — ASPIRIN 81 MG PO CHEW: 81.0000 mg | CHEWABLE_TABLET | Freq: Every day | ORAL | Status: DC

## 2014-04-12 MED ORDER — SODIUM CHLORIDE 0.9 % IV SOLN: 250.0000 mL | INTRAVENOUS | Status: DC | PRN

## 2014-04-12 MED ORDER — FINASTERIDE 5 MG PO TABS
5.0000 mg | ORAL_TABLET | Freq: Every day | ORAL | Status: DC
  Administered 2014-04-12 – 2014-04-13 (×2): 5 mg via ORAL
  Filled 2014-04-12 (×4): qty 1

## 2014-04-12 MED ORDER — PRAVASTATIN SODIUM 80 MG PO TABS
80.0000 mg | ORAL_TABLET | Freq: Every day | ORAL | Status: DC
  Administered 2014-04-12: 80 mg via ORAL
  Filled 2014-04-12 (×3): qty 1

## 2014-04-12 MED ORDER — HEPARIN SODIUM (PORCINE) 1000 UNIT/ML IJ SOLN
INTRAMUSCULAR | Status: AC
  Filled 2014-04-12: qty 1

## 2014-04-12 MED ORDER — SODIUM CHLORIDE 0.9 % IJ SOLN: 3.0000 mL | Freq: Two times a day (BID) | INTRAMUSCULAR | Status: DC

## 2014-04-12 NOTE — Interval H&P Note (Signed)
Cath Lab Visit (complete for each Cath Lab visit)  Clinical Evaluation Leading to the Procedure:   ACS: No.  Non-ACS:    Anginal Classification: CCS III  Anti-ischemic medical therapy: No Therapy  Non-Invasive Test Results: High-risk stress test findings: cardiac mortality >3%/year  Prior CABG: No previous CABG      History and Physical Interval Note:  04/12/2014 7:41 AM  Doran Heater Stelzer  has presented today for surgery, with the diagnosis of acsemia  The various methods of treatment have been discussed with the patient and family. After consideration of risks, benefits and other options for treatment, the patient has consented to  Procedure(s): LEFT HEART CATHETERIZATION WITH CORONARY ANGIOGRAM (N/A) as a surgical intervention .  The patient's history has been reviewed, patient examined, no change in status, stable for surgery.  I have reviewed the patient's chart and labs.  Questions were answered to the patient's satisfaction.     Deyon Chizek S.

## 2014-04-12 NOTE — Progress Notes (Signed)
TR BAND REMOVAL  LOCATION:  right radial  DEFLATED PER PROTOCOL:  Yes.    TIME BAND OFF / DRESSING APPLIED:   1430   SITE UPON ARRIVAL:   Level 0  SITE AFTER BAND REMOVAL:  Level 1  REVERSE ALLEN'S TEST:    positive  CIRCULATION SENSATION AND MOVEMENT:  Within Normal Limits  Yes.    COMMENTS:  Delay in band removal 2 reasons site developed small hematoma that need pressure held and site did bleed 2 times during deflation. I will continue monitor site after band removal. Patient compliant with not moving the hand etc.

## 2014-04-12 NOTE — CV Procedure (Signed)
       PROCEDURE:  Left heart catheterization with selective coronary angiography, left ventriculogram. PCI LAD  INDICATIONS:  Abnormal stress test  The risks, benefits, and details of the procedure were explained to the patient.  The patient verbalized understanding and wanted to proceed.  Informed written consent was obtained.  PROCEDURE TECHNIQUE:  After Xylocaine anesthesia a 2F slender sheath was placed in the right radial artery with a single anterior needle wall stick.   IV Heparin was given.  Right coronary angiography was done using a Judkins R4 guide catheter.  Left coronary angiography was done using a Judkins L3.5 guide catheter.  Left ventriculography was done using a pigtail catheter.  A TR band was used for hemostasis.   CONTRAST:  Total of 130 cc.  COMPLICATIONS:  None.    HEMODYNAMICS:  Aortic pressure was 95/62; LV pressure was 98/4; LVEDP 7.  There was no gradient between the left ventricle and aorta.    ANGIOGRAPHIC DATA:   The left main coronary artery is widely patent.  The left anterior descending artery is a medium-size vessel. There is moderate disease proximally. In the mid vessel, there is a 99% stenosis resulting in TIMI 2 flow in the remainder of the vessel. There are 2 medium-size diagonals, both of which are widely patent. One originates before the severe stenosis and one originates after that. Both are patent.  The left circumflex artery is a large vessel. The first obtuse marginal is a large vessel which is widely patent. The second obtuse marginal is also a large vessel which is widely patent. The remainder of the circumflex is patent.  The right coronary artery is a medium size, dominant vessel. There is moderate disease in the proximal vessel. There is mild disease in the mid vessel and a mild focal lesion in the distal vessel. The posterior lateral artery is medium-sized and patent. The posterior descending artery a small and patent.  LEFT  VENTRICULOGRAM:  Left ventricular angiogram was done in the 30 RAO projection and revealed normal left ventricular wall motion and systolic function with an estimated ejection fraction of 60%.  LVEDP was 7 mmHg.  PCI NARRATIVE: A CLS 3.0 guiding catheters using his left main. IV heparin and IV tirofiban were used for anticoagulation. ACT was used to check that the anticoagulation was therapeutic. A fielder XT wire was placed across the area of disease in the LAD. There was difficulty in getting the fielder wire into the LAD due to an acute angle.  A 2.0 x 20 balloon was used to predilate the entire area of disease, which was a long segment. A 2.25 x 38 Synergy drug-eluting stent was deployed across the entire area disease. The stent was postdilated with a 2.5 x 15 noncompliant balloon to high pressure. The patient tolerated the procedure well. There was an excellent angiographic result.  IMPRESSIONS:  1. Normal left main coronary artery. 2. 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  This was successfully treated with a 2.25 x 38 Synergy drug-eluting stent, postdilated to 2.6 mm in diameter. 3. Widely patent  left circumflex artery and its branches. 4. Mild disease in the right coronary artery. 5. Normal left ventricular systolic function.  LVEDP 8 mmHg.  Ejection fraction 60%.  RECOMMENDATION:  Continue dual antiplatelet therapy for at least a year. Continue aggressive secondary prevention. Continue IV tirofiban for 6 hours. He will be watched overnight. The patient will follow-up with Dr. Domenic Polite.

## 2014-04-12 NOTE — Progress Notes (Signed)
AUC  Ischemic Symptoms? CCS III (Marked limitation of ordinary activity) Anti-ischemic Medical Therapy? No Therapy Non-invasive Test Results? High-risk stress test findings: cardiac mortality >3%/yr Prior CABG? No Previous CABG   Patient Information:   1-2V CAD, no prox LAD  A (8)  Indication: 18; Score: 8   Patient Information:   CTO of 1 vessel, no other CAD  A (7)  Indication: 28; Score: 7   Patient Information:   1V CAD with prox LAD  A (9)  Indication: 34; Score: 9   Patient Information:   2V-CAD with prox LAD  A (9)  Indication: 40; Score: 9   Patient Information:   3V-CAD without LMCA  A (9)  Indication: 46; Score: 9   Patient Information:   3V-CAD without LMCA With Abnormal LV systolic function  A (9)  Indication: 48; Score: 9   Patient Information:   LMCA-CAD  A (9)  Indication: 49; Score: 9   Patient Information:   2V-CAD with prox LAD PCI  A (7)  Indication: 62; Score: 7   Patient Information:   2V-CAD with prox LAD CABG  A (8)  Indication: 62; Score: 8   Patient Information:   3V-CAD without LMCA With Low CAD burden(i.e., 3 focal stenoses, low SYNTAX score) PCI  A (7)  Indication: 63; Score: 7   Patient Information:   3V-CAD without LMCA With Low CAD burden(i.e., 3 focal stenoses, low SYNTAX score) CABG  A (9)  Indication: 63; Score: 9   Patient Information:   3V-CAD without LMCA E06c - Intermediate-high CAD burden (i.e., multiple diffuse lesions, presence of CTO, or high SYNTAX score) PCI  U (4)  Indication: 64; Score: 4   Patient Information:   3V-CAD without LMCA E06c - Intermediate-high CAD burden (i.e., multiple diffuse lesions, presence of CTO, or high SYNTAX score) CABG  A (9)  Indication: 64; Score: 9   Patient Information:   LMCA-CAD With Isolated LMCA stenosis  PCI  U (6)  Indication: 65; Score: 6   Patient Information:   LMCA-CAD With Isolated LMCA stenosis  CABG  A (9)   Indication: 65; Score: 9   Patient Information:   LMCA-CAD Additional CAD, low CAD burden (i.e., 1- to 2-vessel additional involvement, low SYNTAX score) PCI  U (5)  Indication: 66; Score: 5   Patient Information:   LMCA-CAD Additional CAD, low CAD burden (i.e., 1- to 2-vessel additional involvement, low SYNTAX score) CABG  A (9)  Indication: 66; Score: 9   Patient Information:   LMCA-CAD Additional CAD, intermediate-high CAD burden (i.e., 3-vessel involvement, presence of CTO, or high SYNTAX score) PCI  I (3)  Indication: 67; Score: 3   Patient Information:   LMCA-CAD Additional CAD, intermediate-high CAD burden (i.e., 3-vessel involvement, presence of CTO, or high SYNTAX score) CABG  A (9)  Indication: 67; Score: [place], MD

## 2014-04-12 NOTE — H&P (View-Only) (Signed)
Primary physician Dr. Edrick Oh Cardiologist's Dr. Domenic Polite   HPI: This is a 64 year old male patient Dr. Domenic Polite saw on 02/28/14 for exertional chest pain. He takes Pravachol for hyperlipidemia and has no history of hypertension or diabetes mellitus. Echo in December 2014 was technically difficult but showed grossly normal LV systolic function with grade 2 diastolic discussion. Carotid Dopplers in December 2014 showed mild bilateral ICA atherosclerosis.  Stress Myoview on 04/05/14 showed a large area of inferior, inferoseptal, and inferoapical ischemia. He also had mild inferior and septal wall hypokinesis EF 60%. The patient did experience nonlimiting chest pain during the stress portion and developed 2 mm ST depression in the inferior leads. This was felt to be high risk stress test finding and he is here today to be scheduled for cardiac catheterization.  No Known Allergies   Current Outpatient Prescriptions  Medication Sig Dispense Refill  . aspirin EC 81 MG tablet Take 81 mg by mouth daily.    . finasteride (PROSCAR) 5 MG tablet Take 5 mg by mouth daily.    . Multiple Vitamin (MULTIVITAMIN WITH MINERALS) TABS tablet Take 1 tablet by mouth daily.    . pravastatin (PRAVACHOL) 80 MG tablet Take 80 mg by mouth daily.     No current facility-administered medications for this visit.    Past Medical History  Diagnosis Date  . Hyperlipidemia   . Prostatic hypertrophy   . Elevated PSA     Biopsies negative 2009   . 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 Carotid artery disease     Carotid Dopplers December 2014 with 1-39% bilateral ICA stenoses    No past surgical history on file.  Family History  Problem Relation Age of Onset  . Hypertension Mother   . Hypertension Father   . CAD Father     Premature disease  . AAA (abdominal aortic aneurysm) Father     Died in his 1s    History   Social History  . Marital Status: Married    Spouse Name: N/A    Number of  Children: N/A  . Years of Education: N/A   Occupational History  . Not on file.   Social History Main Topics  . Smoking status: Never Smoker   . Smokeless tobacco: Not on file  . Alcohol Use: No  . Drug Use: No  . Sexual Activity: Not on file   Other Topics Concern  . Not on file   Social History Narrative    ROS: See HPI Eyes: Negative Ears:Negative for hearing loss, tinnitus Cardiovascular: Negative for palpitations,irregular heartbeat, dyspnea, near-syncope, orthopnea, paroxysmal nocturnal dyspnea and syncope,edema, claudication, cyanosis,.  Respiratory:   Negative for cough, hemoptysis, shortness of breath, sleep disturbances due to breathing, sputum production and wheezing.   Endocrine: Negative for cold intolerance and heat intolerance.  Hematologic/Lymphatic: Negative for adenopathy and bleeding problem. Does not bruise/bleed easily.  Musculoskeletal: Negative.   Gastrointestinal: Negative for nausea, vomiting, reflux, abdominal pain, diarrhea, constipation.   Genitourinary: Negative for bladder incontinence, dysuria, flank pain, frequency, hematuria, hesitancy, nocturia and urgency.  Neurological: Negative.  Allergic/Immunologic: Negative for environmental allergies.   BP 132/82 mmHg  Pulse 80  Ht 5\' 8"  (1.727 m)  Wt 184 lb (83.462 kg)  BMI 27.98 kg/m2  PHYSICAL EXAM: Well-nournished, in no acute distress. Neck: No JVD, HJR, Bruit, or thyroid enlargement  Lungs: No tachypnea, clear without wheezing, rales, or rhonchi  Cardiovascular: RRR, PMI not displaced, Normal S1 and S2, positive S4, no murmurs,  bruit, thrill, or heave.  Abdomen: BS normal. Soft without organomegaly, masses, lesions or tenderness.  Extremities: without cyanosis, clubbing or edema. Good distal pulses bilateral  SKin: Warm, no lesions or rashes   Musculoskeletal: No deformities  Neuro: no focal signs   Wt Readings from Last 3 Encounters:  02/28/14 182 lb (82.555 kg)  03/12/13 180  lb 11.2 oz (81.965 kg)    Lab Results  Component Value Date   WBC 7.6 03/12/2013   HGB 16.0 03/12/2013   HCT 47.0 03/12/2013   PLT 215 03/12/2013   GLUCOSE 103* 03/12/2013   CHOL 195 03/12/2013   TRIG 293* 03/12/2013   HDL 32* 03/12/2013   LDLCALC 104* 03/12/2013   ALT 23 03/12/2013   AST 25 03/12/2013   NA 141 03/12/2013   K 4.3 03/12/2013   CL 104 03/12/2013   CREATININE 1.10 03/12/2013   BUN 22 03/12/2013   CO2 25 03/12/2013   INR 1.00 03/12/2013   HGBA1C 5.7* 03/12/2013    EKG: Not performed, just had a stress test.  IMPRESSION:  Stress myoview 04/05/13: 1. Large area of inferior, inferoseptal, inferoapical ischemia.   2. There is mild inferior and septal wall hypokinesis.   3. Left ventricular ejection fraction 60%   4. Abnormal stress EKG findings with evidence of ischemia. The patient did experience non limiting chest pain. Duke treadmill score of -5, consistent with moderate risk for major cardiac events.   5. High-risk stress test findings*. High risk findings due to a large area of myocardium at jeopardy.   *2012 Appropriate Use Criteria for Coronary Revascularization Focused Update: J Am Coll Cardiol. 5176;16(0):737-106. http://content.airportbarriers.com.aspx?articleid=1201161     Electronically Signed   By: Carlyle Dolly   On: 04/05/2014 13:09 Summary:  - The vertebral arteries appear patent with antegrade flow. - Findings consistent with 1- 39 percent stenosis involving   the right internal carotid artery and the left internal   carotid artery. - . Other specific details can be found in the table(s) above.    Prepared and Electronically Authenticated by

## 2014-04-13 DIAGNOSIS — N400 Enlarged prostate without lower urinary tract symptoms: Secondary | ICD-10-CM | POA: Diagnosis present

## 2014-04-13 DIAGNOSIS — I2510 Atherosclerotic heart disease of native coronary artery without angina pectoris: Secondary | ICD-10-CM | POA: Diagnosis not present

## 2014-04-13 DIAGNOSIS — R931 Abnormal findings on diagnostic imaging of heart and coronary circulation: Secondary | ICD-10-CM

## 2014-04-13 LAB — CBC
HCT: 43.5 % (ref 39.0–52.0)
HEMOGLOBIN: 15.4 g/dL (ref 13.0–17.0)
MCH: 31.4 pg (ref 26.0–34.0)
MCHC: 35.4 g/dL (ref 30.0–36.0)
MCV: 88.6 fL (ref 78.0–100.0)
Platelets: 179 10*3/uL (ref 150–400)
RBC: 4.91 MIL/uL (ref 4.22–5.81)
RDW: 12.4 % (ref 11.5–15.5)
WBC: 7.2 10*3/uL (ref 4.0–10.5)

## 2014-04-13 LAB — BASIC METABOLIC PANEL
ANION GAP: 5 (ref 5–15)
BUN: 21 mg/dL (ref 6–23)
CO2: 27 mmol/L (ref 19–32)
Calcium: 8.7 mg/dL (ref 8.4–10.5)
Chloride: 107 mEq/L (ref 96–112)
Creatinine, Ser: 1.07 mg/dL (ref 0.50–1.35)
GFR, EST AFRICAN AMERICAN: 82 mL/min — AB (ref >90)
GFR, EST NON AFRICAN AMERICAN: 71 mL/min — AB (ref >90)
GLUCOSE: 111 mg/dL — AB (ref 70–99)
POTASSIUM: 4.5 mmol/L (ref 3.5–5.1)
Sodium: 139 mmol/L (ref 135–145)

## 2014-04-13 MED ORDER — ACETAMINOPHEN 325 MG PO TABS: 650.0000 mg | ORAL_TABLET | ORAL | Status: DC | PRN

## 2014-04-13 MED ORDER — CLOPIDOGREL BISULFATE 75 MG PO TABS: 75.0000 mg | ORAL_TABLET | Freq: Every day | ORAL | Status: DC

## 2014-04-13 MED ORDER — NITROGLYCERIN 0.4 MG SL SUBL: 0.4000 mg | SUBLINGUAL_TABLET | SUBLINGUAL | Status: DC | PRN

## 2014-04-13 NOTE — Discharge Instructions (Signed)
Coronary Angiogram With Stent, Care After °Refer to this sheet in the next few weeks. These instructions provide you with information on caring for yourself after your procedure. Your health care provider may also give you more specific instructions. Your treatment has been planned according to current medical practices, but problems sometimes occur. Call your health care provider if you have any problems or questions after your procedure.  °WHAT TO EXPECT AFTER THE PROCEDURE  °The insertion site may be tender for a few days after your procedure. °HOME CARE INSTRUCTIONS  °· Take medicines only as directed by your health care provider. Blood thinners may be prescribed after your procedure to improve blood flow through the stent. °· Change any bandages (dressings) as directed by your health care provider.   °· Check your insertion site every day for redness, swelling, or fluid leaking from the insertion.   °· Do not take baths, swim, or use a hot tub until your health care provider approves. You may shower. Pat the insertion area dry. Do not rub the insertion area with a washcloth or towel.   °· Eat a heart-healthy diet. This should include plenty of fresh fruits and vegetables. Meat should be lean cuts. Avoid the following types of food:   °¨ Food that is high in salt.   °¨ Canned or highly processed food.   °¨ Food that is high in saturated fat or sugar.   °¨ Fried food.   °· Make any other lifestyle changes recommended by your health care provider. This may include:   °¨ Not using any tobacco products including cigarettes, chewing tobacco, or electronic cigarettes.  °¨ Managing your weight.   °¨ Getting regular exercise.   °¨ Managing your blood pressure.   °¨ Limiting your alcohol intake.   °¨ Managing other health problems, such as diabetes.   °· If you need an MRI after your heart stent was placed, be sure to tell the health care provider who orders the MRI that you have a heart stent.   °· Keep all follow-up  visits as directed by your health care provider.   °SEEK IMMEDIATE MEDICAL CARE IF:  °· You develop chest pain, shortness of breath, feel faint, or pass out. °· You have bleeding, swelling larger than a walnut, or drainage from the catheter insertion site. °· You develop pain, discoloration, coldness, or severe bruising in the leg or arm that held the catheter. °· You develop bleeding from any other place such as from the bowels. There may be bright red blood in the urine or stools, or it may appear as black, tarry stools. °· You have a fever or chills. °MAKE SURE YOU: °· Understand these instructions. °· Will watch your condition. °· Will get help right away if you are not doing well or get worse. °Document Released: 10/02/2004 Document Revised: 07/30/2013 Document Reviewed: 08/16/2012 °Exit[employer]® Patient Information ©2015 [employer]. This information is not intended to replace advice given to you by your health care provider. Make sure you discuss any questions you have with your health care provider. ° °

## 2014-04-13 NOTE — Progress Notes (Signed)
Patient ID: Jesse Brady, male   DOB: [birth year], 64 y.o.   MRN: [id number]     Subjective:    No complaints overnight   Objective:   Temp:  [97.4 F (36.3 C)-98.9 F (37.2 C)] 98.6 F (37 C) (01/16 0400) Pulse Rate:  [72-92] 72 (01/16 0400) Resp:  [18-20] 20 (01/16 0400) BP: (106-132)/(68-76) 129/76 mmHg (01/16 0400) SpO2:  [94 %-98 %] 97 % (01/16 0400) Weight:  [179 lb 14.3 oz (81.6 kg)-180 lb 1.9 oz (81.7 kg)] 180 lb 1.9 oz (81.7 kg) (01/16 0500) Last BM Date: 04/11/14  Filed Weights   04/12/14 0606 04/12/14 0910 04/13/14 0500  Weight: 180 lb (81.647 kg) 179 lb 14.3 oz (81.6 kg) 180 lb 1.9 oz (81.7 kg)    Intake/Output Summary (Last 24 hours) at 04/13/14 0723 Last data filed at 04/12/14 1910  Gross per 24 hour  Intake 1171.04 ml  Output    400 ml  Net 771.04 ml    Telemetry: NSR  Exam:  General: NAD  Resp: CTAB  Cardiac: RRR, no m/r/g, no JVD, no carotid bruits  GI: abdomen soft, Nt, ND  MSK: no LE edema. Right radial site soft, no hematoma  Neuro: no focal deficits  Psych: appropriate affect  Lab Results:  Basic Metabolic Panel:  Recent Labs Lab 04/10/14 1350 04/13/14 0346  NA 136 139  K 4.3 4.5  CL 102 107  CO2 25 27  GLUCOSE 84 111*  BUN 23 21  CREATININE 1.04 1.07  CALCIUM 9.4 8.7    Liver Function Tests:  Recent Labs Lab 04/10/14 1350  AST 23  ALT 26  ALKPHOS 60  BILITOT 0.7  PROT 7.3  ALBUMIN 4.3    CBC:  Recent Labs Lab 04/10/14 1350 04/12/14 2000 04/13/14 0346  WBC 7.7  --  7.2  HGB 16.3  --  15.4  HCT 46.5  --  43.5  MCV 88.4  --  88.6  PLT 250 199 179    Cardiac Enzymes: No results for input(s): CKTOTAL, CKMB, CKMBINDEX, TROPONINI in the last 168 hours.  BNP: No results for input(s): PROBNP in the last 8760 hours.  Coagulation:  Recent Labs Lab 04/10/14 1350  INR 0.99    ECG:   Medications:   Scheduled Medications: . aspirin EC  81 mg Oral Daily  . clopidogrel  75 mg Oral Q breakfast    . finasteride  5 mg Oral Daily  . pravastatin  80 mg Oral q1800     Infusions:     PRN Medications:  acetaminophen, ondansetron (ZOFRAN) IV   Cath 04/12/14 IMPRESSIONS:  1. Normal left main coronary artery. 2. 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 This was successfully treated with a 2.25 x 38 Synergy drug-eluting stent, postdilated to 2.6 mm in diameter. 3. Widely patent left circumflex artery and its branches. 4. Mild disease in the right coronary artery. 5. Normal left ventricular systolic function. LVEDP 8 mmHg. Ejection fraction 60%.  RECOMMENDATION: Continue dual antiplatelet therapy for at least a year. Continue aggressive secondary prevention. Continue IV tirofiban for 6 hours. He will be watched overnight. The patient will follow-up with Dr. Domenic Polite.  Assessment/Plan     1. Chest pain - abnormal nuclear study, referred for cath.  - patient s/p cath yesterday, received DES to mid LAD.  - post cath labs Cr 1.07, Hgb 15.4. EKG NSR no acute changes - on ASA and plavix. Refer to cardiac rehab, f/u with Dr Domenic Polite 3-4 weeks.  Will provide work excuse for Mon and Tues      Carlyle Dolly, M.D.,

## 2014-04-13 NOTE — Progress Notes (Signed)
PHASE I Cardiac Rehab [phone]  Pt ambulating independently on his own.  Pt  Risk factor reduction education completed including exercise, diet, medication compliance, NTG use.   Teach back used.  Pt and spouse verablized understanding.  Pt oriented to outpatient cardaic rehab.  Pt declined planning to exercise on his own at [facility].

## 2014-04-13 NOTE — Discharge Summary (Signed)
Patient ID: Jesse Brady,  MRN: [id number], DOB/AGE: 02-Feb-1951 64 y.o.  Admit date: 04/12/2014 Discharge date: 04/13/2014  Primary Care Provider: Sherrie Mustache, MD Primary Cardiologist: Dr Domenic Polite [facility])  Discharge Diagnoses Active Problems:   Abnormal nuclear stress test   CAD S/P LAD DES 04/12/14   Hyperlipidemia   BPH (benign prostatic hyperplasia)    Procedures:  Cath/ LAD DES 04/12/14   Hospital Course:  64 year old male, seen by Dr. Domenic Polite saw on 02/28/14 for exertional chest pain. He takes Pravachol for hyperlipidemia and has no history of hypertension or diabetes mellitus. Echo in December 2014 was technically difficult but showed grossly normal LV systolic function with grade 2 diastolic discussion. Carotid Dopplers in December 2014 showed mild bilateral ICA atherosclerosis.           Stress Myoview on 04/05/14 showed a large area of inferior, inferoseptal, and inferoapical ischemia. He also had mild inferior and septal wall hypokinesis EF 60%. The patient did experience nonlimiting chest pain during the stress portion and developed 2 mm ST depression in the inferior leads. This was felt to be high risk stress test finding and he is here today to be scheduled for cardiac catheterization. Cath done 04/12/14 revealed severe disease in the mid left anterior descending artery. This was successfully treated with a 2.25 x 38 Synergy drug-eluting stent. The pt tolerated the procedure well and is stable for discharge 04/13/14.    Discharge Vitals:  Blood pressure 154/84, pulse 80, temperature 98 F (36.7 C), temperature source Oral, resp. rate 20, height 5\' 8"  (1.727 m), weight 180 lb 1.9 oz (81.7 kg), SpO2 97 %.    Labs: Results for orders placed or performed during the hospital encounter of 04/12/14 (from the past 24 hour(s))  POCT Activated clotting time     Status: None   Collection Time: 04/12/14  8:35 AM  Result Value Ref Range   Activated Clotting Time  361 seconds  Platelet count     Status: None   Collection Time: 04/12/14  8:00 PM  Result Value Ref Range   Platelets 199 150 - 400 K/uL  Basic metabolic panel     Status: Abnormal   Collection Time: 04/13/14  3:46 AM  Result Value Ref Range   Sodium 139 135 - 145 mmol/L   Potassium 4.5 3.5 - 5.1 mmol/L   Chloride 107 96 - 112 mEq/L   CO2 27 19 - 32 mmol/L   Glucose, Bld 111 (H) 70 - 99 mg/dL   BUN 21 6 - 23 mg/dL   Creatinine, Ser 1.07 0.50 - 1.35 mg/dL   Calcium 8.7 8.4 - 10.5 mg/dL   GFR calc non Af Amer 71 (L) >90 mL/min   GFR calc Af Amer 82 (L) >90 mL/min   Anion gap 5 5 - 15  CBC     Status: None   Collection Time: 04/13/14  3:46 AM  Result Value Ref Range   WBC 7.2 4.0 - 10.5 K/uL   RBC 4.91 4.22 - 5.81 MIL/uL   Hemoglobin 15.4 13.0 - 17.0 g/dL   HCT 43.5 39.0 - 52.0 %   MCV 88.6 78.0 - 100.0 fL   MCH 31.4 26.0 - 34.0 pg   MCHC 35.4 30.0 - 36.0 g/dL   RDW 12.4 11.5 - 15.5 %   Platelets 179 150 - 400 K/uL    Disposition:      Follow-up Information    Follow up with Rozann Lesches, MD.   Specialty:  Cardiology   Why:  office will call you   Contact information:   [place] [id number] [phone]       Discharge Medications:    Medication List    TAKE these medications        acetaminophen 325 MG tablet  Commonly known as:  TYLENOL  Take 2 tablets (650 mg total) by mouth every 4 (four) hours as needed for headache or mild pain.     aspirin EC 81 MG tablet  Take 81 mg by mouth daily.     clopidogrel 75 MG tablet  Commonly known as:  PLAVIX  Take 1 tablet (75 mg total) by mouth daily with breakfast.     finasteride 5 MG tablet  Commonly known as:  PROSCAR  Take 5 mg by mouth daily.     multivitamin with minerals Tabs tablet  Take 1 tablet by mouth daily.     nitroGLYCERIN 0.4 MG SL tablet  Commonly known as:  NITROSTAT  Place 1 tablet (0.4 mg total) under the tongue every 5 (five) minutes as needed for chest pain.      pravastatin 80 MG tablet  Commonly known as:  PRAVACHOL  Take 80 mg by mouth daily.         Duration of Discharge Encounter: Greater than 30 minutes including physician time.  Angelena Form PA-C 04/13/2014 8:31 AM

## 2014-04-17 ENCOUNTER — Ambulatory Visit: Payer: Medicare Other | Admitting: Cardiology

## 2014-04-22 ENCOUNTER — Encounter: Payer: Self-pay | Admitting: Cardiology

## 2014-04-22 ENCOUNTER — Ambulatory Visit (INDEPENDENT_AMBULATORY_CARE_PROVIDER_SITE_OTHER): Payer: Medicare Other | Admitting: Cardiology

## 2014-04-22 VITALS — BP 122/79 | HR 66 | Ht 69.0 in | Wt 181.1 lb

## 2014-04-22 DIAGNOSIS — I2510 Atherosclerotic heart disease of native coronary artery without angina pectoris: Secondary | ICD-10-CM

## 2014-04-22 DIAGNOSIS — E785 Hyperlipidemia, unspecified: Secondary | ICD-10-CM

## 2014-04-22 NOTE — Assessment & Plan Note (Signed)
Angina has improved significantly status post recent DES to the mid LAD as outlined above. Continue medical therapy including DAPT. Follow-up in 6 months, sooner if needed.

## 2014-04-22 NOTE — Assessment & Plan Note (Signed)
Followed by Dr. Edrick Oh. LDL was 105 in December 2015, has been coming down on Pravachol. Try to get closer to 70 if possible.

## 2014-04-22 NOTE — Patient Instructions (Signed)

## 2014-04-22 NOTE — Progress Notes (Signed)
Reason for visit: Hospital follow-up, CAD  Clinical Summary Mr. Reasons is a 64 y.o.male seen by me in December 2015 with symptoms suggestive of angina. He was referred for an exercise Cardiolite which showed abnormal ST segment changes consistent with ischemia, and a large area of inferior, inferoseptal, and inferoapical ischemia with LVEF 60%. Following this he was referred for diagnostic heart catheterization, performed by Dr. Irish Lack on January 15. He was found to have severe disease involving the mid LAD and underwent successful DES placement.  He comes in today for a follow-up visit, states that he has been doing very well, angina has significantly improved. He thinks that he may have overdone it somewhat shoveling snow over the weekend, his left arm has been sore with some tingling, but not consistent with his angina. We reviewed his medications, discussed the importance of the DAPT, also continuing his regular exercise regimen.  Lab work from January showed potassium 4.5, BUN 21, creatinine 1.0, hemoglobin 15.4, platelets 179. Previous lipid panel from December 2015 showed LDL 105, he continues on Pravachol.   No Known Allergies  Current Outpatient Prescriptions  Medication Sig Dispense Refill  . acetaminophen (TYLENOL) 325 MG tablet Take 2 tablets (650 mg total) by mouth every 4 (four) hours as needed for headache or mild pain.    Marland Kitchen aspirin EC 81 MG tablet Take 81 mg by mouth daily.    . clopidogrel (PLAVIX) 75 MG tablet Take 1 tablet (75 mg total) by mouth daily with breakfast. 30 tablet 11  . finasteride (PROSCAR) 5 MG tablet Take 5 mg by mouth daily.    . Multiple Vitamin (MULTIVITAMIN WITH MINERALS) TABS tablet Take 1 tablet by mouth daily.    . nitroGLYCERIN (NITROSTAT) 0.4 MG SL tablet Place 1 tablet (0.4 mg total) under the tongue every 5 (five) minutes as needed for chest pain. 25 tablet 2  . pravastatin (PRAVACHOL) 80 MG tablet Take 80 mg by mouth daily.     No current  facility-administered medications for this visit.    Past Medical History  Diagnosis Date  . Hyperlipidemia   . Prostatic hypertrophy   . Elevated PSA     Biopsies negative 2009   . 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 Carotid artery disease     Carotid Dopplers December 2014 with 1-39% bilateral ICA stenoses  . Coronary artery disease     DES to LAD January 2016  . Melanoma of cheek   . Sleep apnea     Does not use CPAP  . GERD (gastroesophageal reflux disease)     Social History Mr. Fusco reports that he has never smoked. He has never used smokeless tobacco. Mr. Souder reports that he does not drink alcohol.  Review of Systems Complete review of systems negative except as otherwise outlined in the clinical summary and also the following. No bleeding problems, no palpitations or dizziness.  Physical Examination Filed Vitals:   04/22/14 1301  BP: 122/79  Pulse: 66    Wt Readings from Last 3 Encounters:  04/22/14 181 lb 1.9 oz (82.155 kg)  04/13/14 180 lb 1.9 oz (81.7 kg)  04/10/14 184 lb (83.462 kg)   Patient appears comfortable at rest. HEENT: Conjunctiva and lids normal, oropharynx clear. Neck: Supple, no elevated JVP or carotid bruits, no thyromegaly. Lungs: Clear to auscultation, nonlabored breathing at rest. Cardiac: Regular rate and rhythm, no S3 or significant systolic murmur, no pericardial rub. Abdomen: Soft, nontender, no bruits, bowel sounds present, no guarding  or rebound. Extremities: No pitting edema, distal pulses 2+. Some resolving ecchymosis on the plantar aspect of his right forearm. Skin: Warm and dry. Musculoskeletal: No kyphosis. Neuropsychiatric: Alert and oriented x3, affect grossly appropriate.   Problem List and Plan   CAD (coronary artery disease), native coronary artery Angina has improved significantly status post recent DES to the mid LAD as outlined above. Continue medical therapy including DAPT. Follow-up in 6 months,  sooner if needed.   Hyperlipidemia Followed by Dr. Edrick Oh. LDL was 105 in December 2015, has been coming down on Pravachol. Try to get closer to 70 if possible.     Satira Sark, M.D., F.A.C.C.

## 2014-05-07 ENCOUNTER — Other Ambulatory Visit: Payer: Self-pay

## 2014-05-07 MED ORDER — CLOPIDOGREL BISULFATE 75 MG PO TABS: 75.0000 mg | ORAL_TABLET | Freq: Every day | ORAL | Status: DC

## 2014-05-07 NOTE — Telephone Encounter (Signed)
Rx(s) sent to pharmacy electronically.  

## 2014-05-09 ENCOUNTER — Other Ambulatory Visit: Payer: Self-pay | Admitting: *Deleted

## 2014-05-09 MED ORDER — CLOPIDOGREL BISULFATE 75 MG PO TABS: 75.0000 mg | ORAL_TABLET | Freq: Every day | ORAL | Status: DC

## 2014-05-09 NOTE — Telephone Encounter (Signed)
Rx(s) sent to pharmacy electronically.  

## 2014-11-01 ENCOUNTER — Encounter: Payer: Self-pay | Admitting: Cardiology

## 2014-11-01 ENCOUNTER — Ambulatory Visit (INDEPENDENT_AMBULATORY_CARE_PROVIDER_SITE_OTHER): Payer: Medicare Other | Admitting: Cardiology

## 2014-11-01 VITALS — BP 113/72 | HR 64 | Ht 69.0 in | Wt 166.0 lb

## 2014-11-01 DIAGNOSIS — I2510 Atherosclerotic heart disease of native coronary artery without angina pectoris: Secondary | ICD-10-CM | POA: Diagnosis not present

## 2014-11-01 DIAGNOSIS — E785 Hyperlipidemia, unspecified: Secondary | ICD-10-CM

## 2014-11-01 NOTE — Progress Notes (Signed)
Cardiology Office Note  Date: 11/01/2014   ID: CASIN FEDERICI, DOB [birth year], MRN [id number]  PCP: Sherrie Mustache, MD  Primary Cardiologist: Rozann Lesches, MD   Chief Complaint  Patient presents with  . Coronary Artery Disease    History of Present Illness: Jesse Brady is a 64 y.o. male last seen in January.he presents for a follow-up visit, doing well. He reports no angina symptoms or significant shortness of breath with typical ADLs. He has been exercising using a treadmill and stationary bicycle.  We reviewed his medications.cholesterol has been significantly better controlled. His most recent numbers showed cholesterol 121, triglycerides 69, HDL 44, and LDL down to 63. Particle number and size also reviewed. He has really focused on his diet, restricting caloric intake as well as red meats. I think this has a lot to do with it.  He is status post DES to the LAD in January, continues on the DAPT. No bleeding problems.   Past Medical History  Diagnosis Date  . Hyperlipidemia   . Prostatic hypertrophy   . Elevated PSA     Biopsies negative 2009   . 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 Carotid artery disease     Carotid Dopplers December 2014 with 1-39% bilateral ICA stenoses  . Coronary artery disease     DES to LAD January 2016  . Melanoma of cheek   . Sleep apnea     Does not use CPAP  . GERD (gastroesophageal reflux disease)     Past Surgical History  Procedure Laterality Date  . Melanoma excision Right 2000's    cheek  . Left heart catheterization with coronary angiogram N/A 04/12/2014    Procedure: LEFT HEART CATHETERIZATION WITH CORONARY ANGIOGRAM;  Surgeon: Jettie Booze, MD;  Location: [facility] CATH LAB;  Service: Cardiovascular;  Laterality: N/A;    Current Outpatient Prescriptions  Medication Sig Dispense Refill  . aspirin EC 81 MG tablet Take 81 mg by mouth daily.    . clopidogrel (PLAVIX) 75 MG tablet Take 1 tablet (75 mg total)  by mouth daily with breakfast. 90 tablet 3  . finasteride (PROSCAR) 5 MG tablet Take 5 mg by mouth daily.    . Multiple Vitamin (MULTIVITAMIN WITH MINERALS) TABS tablet Take 1 tablet by mouth daily.    . nitroGLYCERIN (NITROSTAT) 0.4 MG SL tablet Place 1 tablet (0.4 mg total) under the tongue every 5 (five) minutes as needed for chest pain. 25 tablet 2  . pravastatin (PRAVACHOL) 80 MG tablet Take 80 mg by mouth daily.     No current facility-administered medications for this visit.    Allergies:  Review of patient's allergies indicates no known allergies.   Social History: The patient  reports that he has never smoked. He has never used smokeless tobacco. He reports that he does not drink alcohol or use illicit drugs.   ROS:  Please see the history of present illness. Otherwise, complete review of systems is positive for none.  All other systems are reviewed and negative.   Physical Exam: VS:  BP 113/72 mmHg  Pulse 64  Ht 5\' 9"  (1.753 m)  Wt 166 lb (75.297 kg)  BMI 24.50 kg/m2  SpO2 98%, BMI Body mass index is 24.5 kg/(m^2).  Wt Readings from Last 3 Encounters:  11/01/14 166 lb (75.297 kg)  04/22/14 181 lb 1.9 oz (82.155 kg)  04/13/14 180 lb 1.9 oz (81.7 kg)     Patient appears comfortable at rest. HEENT: Conjunctiva  and lids normal, oropharynx clear. Neck: Supple, no elevated JVP or carotid bruits, no thyromegaly. Lungs: Clear to auscultation, nonlabored breathing at rest. Cardiac: Regular rate and rhythm, no S3 or significant systolic murmur, no pericardial rub. Abdomen: Soft, nontender, no bruits, bowel sounds present, no guarding or rebound. Extremities: No pitting edema, distal pulses 2+. Some resolving ecchymosis on the plantar aspect of his right forearm. Skin: Warm and dry. Musculoskeletal: No kyphosis. Neuropsychiatric: Alert and oriented x3, affect grossly appropriate.   ECG: ECG is not ordered today.   Recent Labwork: 04/10/2014: ALT 26; AST 23 04/13/2014: BUN  21; Creatinine, Ser 1.07; Hemoglobin 15.4; Platelets 179; Potassium 4.5; Sodium 139    Assessment and Plan:  1. Symptomatically stable CAD status post DES to the LAD in January. Continue current regimen, anticipate coming off Plavix January 2017. Office follow-up arranged.  2. Hyperlipidemia, lipids coming under better control. Current regimen includes Pravachol and Zetia. He has also modified his diet significantly.  Current medicines were reviewed with the patient today.  Disposition: FU with me in 6 months.   Signed, Satira Sark, MD, [facility] 11/01/2014 11:04 AM    [facility] [address], [place], [place] [address] Phone: [phone]; Fax: [phone]

## 2014-11-01 NOTE — Patient Instructions (Signed)
Your physician recommends that you continue on your current medications as directed. Please refer to the Current Medication list given to you today. Your physician recommends that you schedule a follow-up appointment in: 5 months. You will receive a reminder letter in the mail in about 2 months reminding you to call and schedule your appointment. If you don't receive this letter, please contact our office.

## 2015-03-26 ENCOUNTER — Other Ambulatory Visit: Payer: Self-pay | Admitting: Cardiology

## 2015-05-05 ENCOUNTER — Ambulatory Visit (INDEPENDENT_AMBULATORY_CARE_PROVIDER_SITE_OTHER): Payer: Medicare Other | Admitting: Cardiology

## 2015-05-05 ENCOUNTER — Encounter: Payer: Self-pay | Admitting: *Deleted

## 2015-05-05 ENCOUNTER — Encounter: Payer: Self-pay | Admitting: Cardiology

## 2015-05-05 VITALS — BP 110/67 | HR 69 | Ht 69.0 in | Wt 173.4 lb

## 2015-05-05 DIAGNOSIS — I2510 Atherosclerotic heart disease of native coronary artery without angina pectoris: Secondary | ICD-10-CM | POA: Diagnosis not present

## 2015-05-05 DIAGNOSIS — E785 Hyperlipidemia, unspecified: Secondary | ICD-10-CM

## 2015-05-05 MED ORDER — CLOPIDOGREL BISULFATE 75 MG PO TABS
ORAL_TABLET | ORAL | Status: DC
Start: 2015-05-05 — End: 2016-06-08

## 2015-05-05 NOTE — Progress Notes (Signed)
Cardiology Office Note  Date: 05/05/2015   ID: Jesse Brady, DOB 1950/07/01, MRN [id number]  PCP: Sherrie Mustache, MD  Primary Cardiologist: Rozann Lesches, MD   Chief Complaint  Patient presents with  . Coronary Artery Disease    History of Present Illness: Jesse Brady is a 65 y.o. male last seen in August 2016. He presents for a routine follow-up visit. Since last encounter he has done well, does not report any angina symptoms and has NYHA class 1-2 dyspnea with typical activities. He is working 3 days a week, plans to increase his regular exercise. He has joined the [employer].  We discussed his medications. He continues on aspirin, Plavix, and has as needed nitroglycerin. Lipid regimen has been modified to include Pravachol and Zetia. His lipids have been fairly well-controlled on this regimen and he has tolerated the medications.  He is one year out from placement of DES to the LAD. We discussed stopping Plavix at this time.  Past Medical History  Diagnosis Date  . Hyperlipidemia   . Prostatic hypertrophy   . Elevated PSA     Biopsies negative 2009   . 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 Carotid artery disease [facility])     Carotid Dopplers December 2014 with 1-39% bilateral ICA stenoses  . Coronary artery disease     DES to LAD January 2016  . Melanoma of cheek ([place])   . Sleep apnea     Does not use CPAP  . GERD (gastroesophageal reflux disease)     Current Outpatient Prescriptions  Medication Sig Dispense Refill  . aspirin EC 81 MG tablet Take 81 mg by mouth daily.    . clopidogrel (PLAVIX) 75 MG tablet Take 1 tablet by mouth  daily with breakfast 90 tablet 3  . ezetimibe (ZETIA) 10 MG tablet Take 1 tablet by mouth daily.    . finasteride (PROSCAR) 5 MG tablet Take 5 mg by mouth daily.    . Multiple Vitamin (MULTIVITAMIN WITH MINERALS) TABS tablet Take 1 tablet by mouth daily.    . nitroGLYCERIN (NITROSTAT) 0.4 MG SL tablet Place 1 tablet (0.4 mg  total) under the tongue every 5 (five) minutes as needed for chest pain. 25 tablet 2  . pravastatin (PRAVACHOL) 80 MG tablet Take 80 mg by mouth daily.     No current facility-administered medications for this visit.   Allergies:  Review of patient's allergies indicates no known allergies.   Social History: The patient  reports that he has never smoked. He has never used smokeless tobacco. He reports that he does not drink alcohol or use illicit drugs.   ROS:  Please see the history of present illness. Otherwise, complete review of systems is positive for occasional arthritic pains.  All other systems are reviewed and negative.   Physical Exam: VS:  BP 110/67 mmHg  Pulse 69  Ht 5\' 9"  (1.753 m)  Wt 173 lb 6.4 oz (78.654 kg)  BMI 25.60 kg/m2  SpO2 98%, BMI Body mass index is 25.6 kg/(m^2).  Wt Readings from Last 3 Encounters:  05/05/15 173 lb 6.4 oz (78.654 kg)  11/01/14 166 lb (75.297 kg)  04/22/14 181 lb 1.9 oz (82.155 kg)    Patient appears comfortable at rest. HEENT: Conjunctiva and lids normal, oropharynx clear. Neck: Supple, no elevated JVP or carotid bruits, no thyromegaly. Lungs: Clear to auscultation, nonlabored breathing at rest. Cardiac: Regular rate and rhythm, no S3 or significant systolic murmur, no pericardial rub. Abdomen:  Soft, nontender, no bruits, bowel sounds present, no guarding or rebound. Extremities: No pitting edema, distal pulses 2+.  ECG: I reviewed his tracing from 04/13/2014 which showed sinus rhythm.  Recent Labwork:  June 2016: Cholesterol 121, triglycerides 69, HDL 44, LDL 63  Assessment and Plan:  1. CAD status post DES to the LAD in January 2016. He is doing well symptomatically on medical therapy. Plan is to stop Plavix. Continue regular exercise and observation. Keep interval follow-up with Dr. Edrick Oh.  2. Hyperlipidemia, has been generally well controlled on combination of Pravachol and Zetia.  Current medicines were reviewed with the  patient today.  Disposition: FU with me in 1 year.   Signed, Satira Sark, MD, [facility] 05/05/2015 4:21 PM    [facility] [place] at [place], [place], [place] [address] Phone: [phone]; Fax: [phone]

## 2015-05-05 NOTE — Patient Instructions (Signed)
Your physician has recommended you make the following change in your medication:  Please stop plavix when you finish your current supply. Continue all other medications the same. Your physician recommends that you schedule a follow-up appointment in: 1 year. You will receive a reminder letter in the mail in about 10 months reminding you to call and schedule your appointment. If you don't receive this letter, please contact our office.

## 2015-11-11 IMAGING — CR DG CHEST 2V
2 series · 2 of 2 positions shown · non-contrast
Comparison: 03/12/2013

CLINICAL DATA: Preop for cardiac catheterization. Chest pain and
shortness of breath.

EXAM:
CHEST  2 VIEW

[view not recorded (1 of 2)]
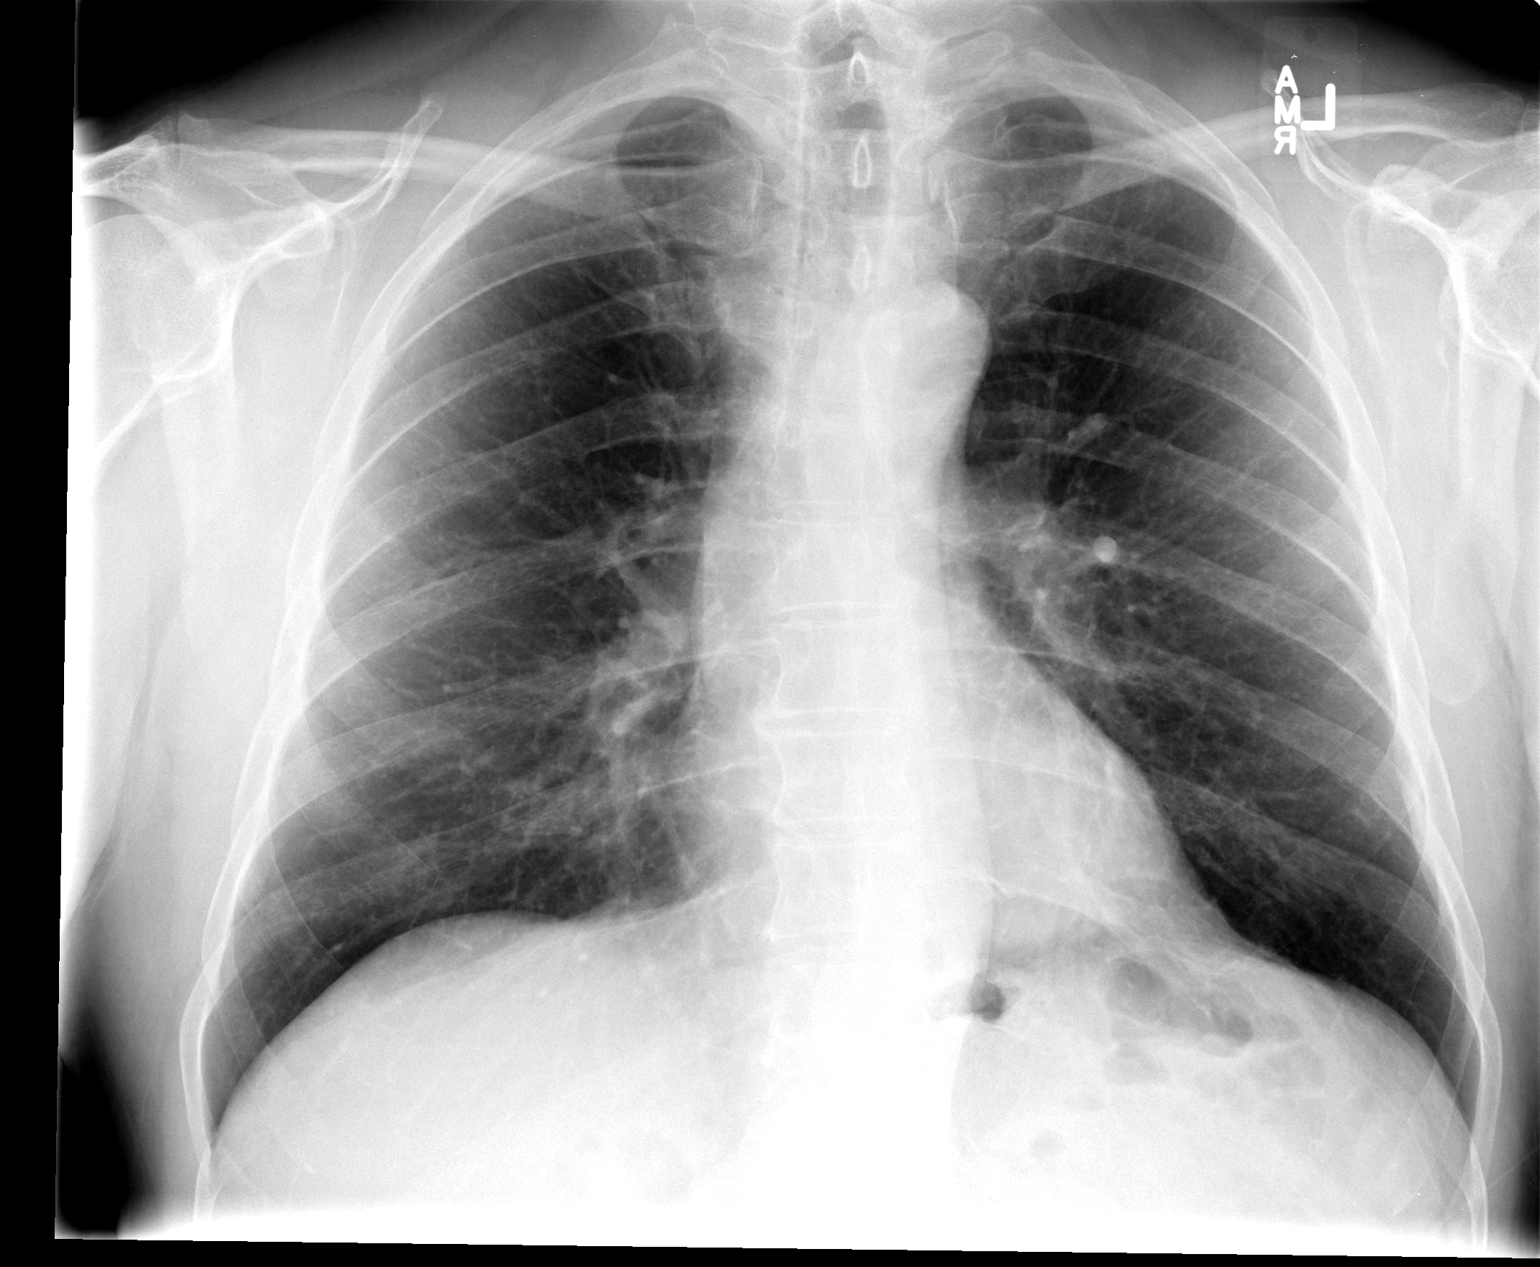

[view not recorded (2 of 2)]
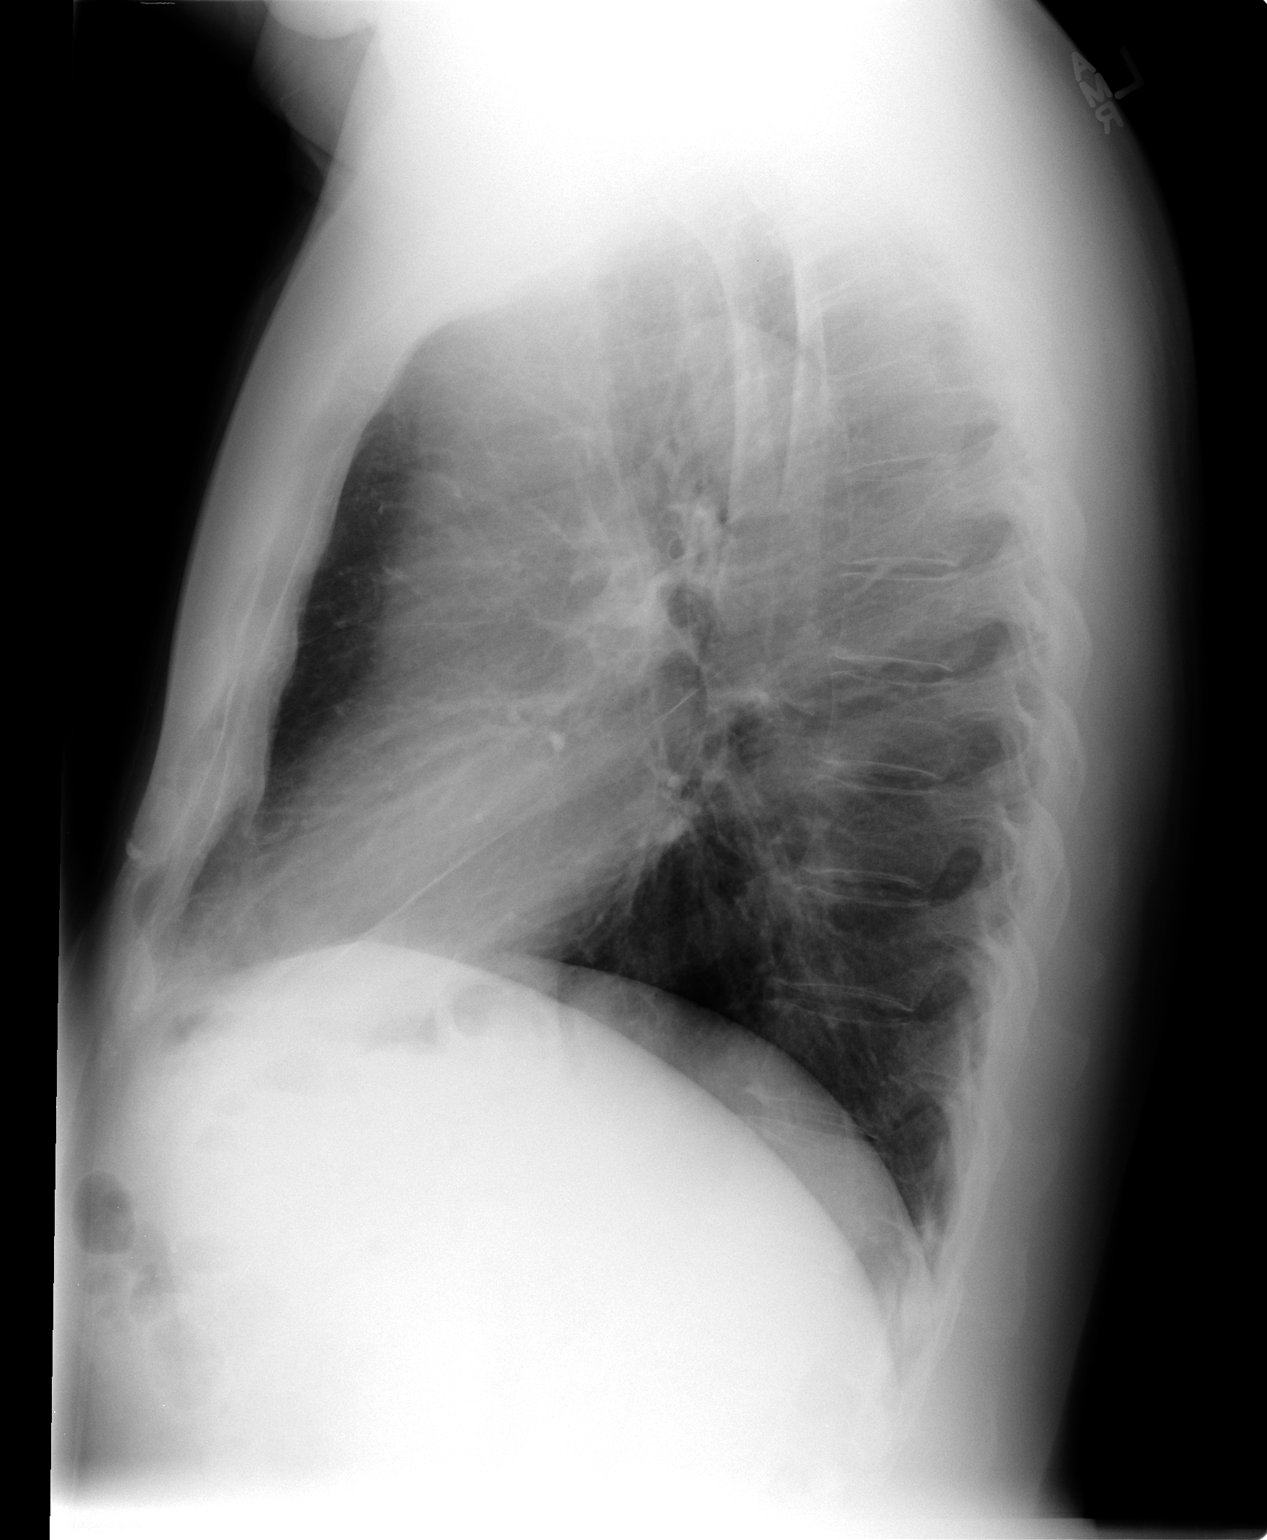

[2 of 2 positions shown; findings below may reference images not displayed]

FINDINGS: The cardiac silhouette, mediastinal and hilar contours are normal
and stable. There is mild tortuosity of the thoracic aorta. The
lungs are clear. No pleural effusion. The bony thorax is intact.
IMPRESSION: No acute cardiopulmonary findings.

## 2015-12-27 ENCOUNTER — Ambulatory Visit (INDEPENDENT_AMBULATORY_CARE_PROVIDER_SITE_OTHER): Payer: Medicare Other

## 2015-12-27 DIAGNOSIS — Z23 Encounter for immunization: Secondary | ICD-10-CM | POA: Diagnosis not present

## 2016-06-08 ENCOUNTER — Ambulatory Visit (INDEPENDENT_AMBULATORY_CARE_PROVIDER_SITE_OTHER): Payer: Medicare Other | Admitting: Cardiology

## 2016-06-08 ENCOUNTER — Encounter: Payer: Self-pay | Admitting: Cardiology

## 2016-06-08 VITALS — BP 120/69 | HR 68 | Ht 69.0 in | Wt 172.0 lb

## 2016-06-08 DIAGNOSIS — I739 Peripheral vascular disease, unspecified: Secondary | ICD-10-CM

## 2016-06-08 DIAGNOSIS — E782 Mixed hyperlipidemia: Secondary | ICD-10-CM | POA: Diagnosis not present

## 2016-06-08 DIAGNOSIS — I779 Disorder of arteries and arterioles, unspecified: Secondary | ICD-10-CM | POA: Diagnosis not present

## 2016-06-08 DIAGNOSIS — Z136 Encounter for screening for cardiovascular disorders: Secondary | ICD-10-CM

## 2016-06-08 DIAGNOSIS — I2510 Atherosclerotic heart disease of native coronary artery without angina pectoris: Secondary | ICD-10-CM

## 2016-06-08 NOTE — Progress Notes (Signed)
Cardiology Office Note  Date: 06/08/2016   ID: Jesse Brady, DOB [birth year], MRN [id number]  PCP: Sherrie Mustache, MD  Primary Cardiologist: Rozann Lesches, MD   Chief Complaint  Patient presents with  . Coronary Artery Disease    History of Present Illness: Jesse Brady is a 66 y.o. male last seen in February 2017. He presents for a routine follow-up visit. Continues to do well without any angina symptoms. 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  I reviewed his medications. He carries on aspirin, Zetia, Pravachol, and has nitroglycerin available. His most recent lipid numbers are outlined below. He continues to follow with Dr. Edrick Oh.  He did have an interval Medicare physical. He has not had a screening abdominal ultrasound for AAA. States that his father died with an abdominal aneurysm.  I personally reviewed his ECG today which shows sinus rhythm with R' in lead V1.  Past Medical History:  Diagnosis Date  . Carotid artery disease [facility])    Carotid Dopplers December 2014 with 1-39% bilateral ICA stenoses  . Coronary artery disease    DES to LAD January 2016  . Elevated PSA    Biopsies negative 2009   . GERD (gastroesophageal reflux disease)   . Hyperlipidemia   . Melanoma of cheek ([place])   . Prostatic hypertrophy   . Sleep apnea    Does not use CPAP  . 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 LEFT HEART CATHETERIZATION WITH CORONARY ANGIOGRAM N/A 04/12/2014   Procedure: LEFT HEART CATHETERIZATION WITH CORONARY ANGIOGRAM;  Surgeon: Jettie Booze, MD;  Location: [facility] CATH LAB;  Service: Cardiovascular;  Laterality: N/A;  . MELANOMA EXCISION Right 2000's   cheek    Current Outpatient Prescriptions  Medication Sig Dispense Refill  . aspirin EC 81 MG tablet Take 81 mg by mouth daily.    Marland Kitchen ezetimibe (ZETIA) 10 MG tablet Take 1 tablet by mouth  daily.    . finasteride (PROSCAR) 5 MG tablet Take 5 mg by mouth daily.    . Multiple Vitamin (MULTIVITAMIN WITH MINERALS) TABS tablet Take 1 tablet by mouth daily.    . nitroGLYCERIN (NITROSTAT) 0.4 MG SL tablet Place 1 tablet (0.4 mg total) under the tongue every 5 (five) minutes as needed for chest pain. 25 tablet 2  . pravastatin (PRAVACHOL) 80 MG tablet Take 80 mg by mouth daily.     No current facility-administered medications for this visit.    Allergies:  Patient has no known allergies.   Social History: The patient  reports that he has never smoked. He has never used smokeless tobacco. He reports that he does not drink alcohol or use drugs.   ROS:  Please see the history of present illness. Otherwise, complete review of systems is positive for none.  All other systems are reviewed and negative.   Physical Exam: VS:  BP 120/69   Pulse 68   Ht 5\' 9"  (1.753 m)   Wt 172 lb (78 kg)   SpO2 98%   BMI 25.40 kg/m , BMI Body mass index is 25.4 kg/m.  Wt Readings from Last 3 Encounters:  06/08/16 172 lb (78 kg)  05/05/15 173 lb 6.4 oz (78.7 kg)  11/01/14 166 lb (75.3 kg)    Patient appears comfortable at rest. HEENT: Conjunctiva and lids normal, oropharynx clear. Neck: Supple, no elevated JVP or carotid bruits, no thyromegaly.  Lungs: Clear to auscultation, nonlabored breathing at rest. Cardiac: Regular rate and rhythm, no S3 or significant systolic murmur, no pericardial rub. Abdomen: Soft, nontender, no bruits, bowel sounds present, no guarding or rebound. Extremities: No pitting edema, distal pulses 2+. Skin: Warm and dry. Musculoskeletal: No kyphosis or Neuropsychiatric: Alert and oriented 3, affect appropriate.  ECG: I personally reviewed the tracing from 04/13/2014 which showed sinus rhythm.  Recent Labwork:  August 2017: Cholesterol 133, triglycerides 70, HDL 49, LDL 70, BUN 19, creatinine 0.9, potassium 4.3, AST 21, ALT 20, hemoglobin 16.1, platelets 190  Other  Studies Reviewed Today:  Cardiac catheterization 04/12/2014: HEMODYNAMICS:  Aortic pressure was 95/62; LV pressure was 98/4; LVEDP 7.  There was no gradient between the left ventricle and aorta.    ANGIOGRAPHIC DATA:   The left main coronary artery is widely patent.  The left anterior descending artery is a medium-size vessel. There is moderate disease proximally. In the mid vessel, there is a 99% stenosis resulting in TIMI 2 flow in the remainder of the vessel. There are 2 medium-size diagonals, both of which are widely patent. One originates before the severe stenosis and one originates after that. Both are patent.  The left circumflex artery is a large vessel. The first obtuse marginal is a large vessel which is widely patent. The second obtuse marginal is also a large vessel which is widely patent. The remainder of the circumflex is patent.  The right coronary artery is a medium size, dominant vessel. There is moderate disease in the proximal vessel. There is mild disease in the mid vessel and a mild focal lesion in the distal vessel. The posterior lateral artery is medium-sized and patent. The posterior descending artery a small and patent.  LEFT VENTRICULOGRAM:  Left ventricular angiogram was done in the 30 RAO projection and revealed normal left ventricular wall motion and systolic function with an estimated ejection fraction of 60%.  LVEDP was 7 mmHg.  PCI NARRATIVE: A CLS 3.0 guiding catheters using his left main. IV heparin and IV tirofiban were used for anticoagulation. ACT was used to check that the anticoagulation was therapeutic. A fielder XT wire was placed across the area of disease in the LAD. There was difficulty in getting the fielder wire into the LAD due to an acute angle.  A 2.0 x 20 balloon was used to predilate the entire area of disease, which was a long segment. A 2.25 x 38 Synergy drug-eluting stent was deployed across the entire area disease. The stent was postdilated  with a 2.5 x 15 noncompliant balloon to high pressure. The patient tolerated the procedure well. There was an excellent angiographic result.  IMPRESSIONS:  1. Normal left main coronary artery. 2. 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  This was successfully treated with a 2.25 x 38 Synergy drug-eluting stent, postdilated to 2.6 mm in diameter. 3. Widely patent  left circumflex artery and its branches. 4. Mild disease in the right coronary artery. 5. Normal left ventricular systolic function.  LVEDP 8 mmHg.  Ejection fraction 60%.  Assessment and Plan:  1. CAD status post DES to the LAD in January 2016. He remained symptom-free on medical therapy with good functional capacity. ECG reviewed. Continue with observation. We will consider follow-up stress testing in about a year.  2. Abdominal ultrasound to screen for AAA. Patient's father died with an abdominal aneurysm.  3. Hyperlipidemia, continues on statin and Zetia, last LDL 70.  4. History of mild carotid atherosclerosis,  asymptomatic.  Current medicines were reviewed with the patient today.   Orders Placed This Encounter  Procedures  . EKG 12-Lead    Disposition: Follow-up in one year.  Signed, Satira Sark, MD, [facility] 06/08/2016 4:52 PM    [place] at [place], [place], [place] [address] Phone: [phone]; Fax: [phone]

## 2016-06-08 NOTE — Patient Instructions (Signed)
   Your physician recommends that you continue on your current medications as directed. Please refer to the Current Medication list given to you today.   Your physician has requested that you have an abdominal aorta duplex. During this test, an ultrasound is used to evaluate the aorta. Allow 30 minutes for this exam. Do not eat after midnight the day before and avoid carbonated beverages   Your physician wants you to follow-up in: 1 year. You will receive a reminder letter in the mail 10 months in advance. If you don't receive a letter, please call our office to schedule the follow-up appointment.

## 2016-06-09 ENCOUNTER — Telehealth: Payer: Self-pay | Admitting: Cardiology

## 2016-06-09 NOTE — Telephone Encounter (Signed)
percert verification   AAA scheduled for 06/30/16 at [facility].

## 2016-06-30 ENCOUNTER — Other Ambulatory Visit: Payer: Self-pay | Admitting: Cardiology

## 2016-06-30 ENCOUNTER — Ambulatory Visit: Payer: Medicare Other

## 2016-06-30 DIAGNOSIS — Z136 Encounter for screening for cardiovascular disorders: Secondary | ICD-10-CM

## 2016-07-02 ENCOUNTER — Telehealth: Payer: Self-pay | Admitting: *Deleted

## 2016-07-02 NOTE — Telephone Encounter (Signed)
-----   Message from Satira Sark, MD sent at 07/02/2016  1:58 PM EDT ----- Results reviewed. Please let him know that abdominal aorta looks good, no aneurysm. A copy of this test should be forwarded to Sherrie Mustache, MD.

## 2016-07-02 NOTE — Telephone Encounter (Signed)
Patient informed. 

## 2016-12-27 DIAGNOSIS — Z77121 Contact with and (suspected) exposure to harmful algae and algae toxins: Secondary | ICD-10-CM | POA: Insufficient documentation

## 2017-06-23 NOTE — Progress Notes (Signed)
Cardiology Office Note  Date: 06/24/2017   ID: Jesse Brady, DOB [birth year], MRN [id number]  PCP: Dione Housekeeper, MD  Primary Cardiologist: Rozann Lesches, MD   Chief Complaint  Patient presents with  . Coronary Artery Disease    History of Present Illness: Jesse Brady is a 67 y.o. male last seen in March 2018.  He presents today for a routine follow-up visit.  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  He does not report any angina symptoms or nitroglycerin use.  Generally has NYHA class I-II dyspnea.  No palpitations or syncope.  I reviewed his cardiac medications which are outlined below.  He reports compliance.  I went over his interval lab work per Dr. Edrick Oh and we are requesting his interval ECG.  Abdominal ultrasound from last year showed no evidence of abdominal aortic aneurysm.  DES intervention to the LAD was in January 2016 as outlined below.  We discussed obtaining a follow-up stress test to reassess ischemic burden.  Past Medical History:  Diagnosis Date  . Carotid artery disease [facility])    Carotid Dopplers December 2014 with 1-39% bilateral ICA stenoses  . Coronary artery disease    DES to LAD January 2016  . Elevated PSA    Biopsies negative 2009   . GERD (gastroesophageal reflux disease)   . Hyperlipidemia   . Melanoma of cheek ([place])   . Prostatic hypertrophy   . Sleep apnea    Does not use CPAP  . 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 LEFT HEART CATHETERIZATION WITH CORONARY ANGIOGRAM N/A 04/12/2014   Procedure: LEFT HEART CATHETERIZATION WITH CORONARY ANGIOGRAM;  Surgeon: Jettie Booze, MD;  Location: [facility] CATH LAB;  Service: Cardiovascular;  Laterality: N/A;  . MELANOMA EXCISION Right 2000's   cheek    Current Outpatient Medications  Medication Sig Dispense Refill  . aspirin EC 81 MG tablet Take 81 mg by mouth daily.    Marland Kitchen ezetimibe  (ZETIA) 10 MG tablet Take 1 tablet by mouth daily.    . finasteride (PROSCAR) 5 MG tablet Take 5 mg by mouth daily.    . Multiple Vitamin (MULTIVITAMIN WITH MINERALS) TABS tablet Take 1 tablet by mouth daily.    . nitroGLYCERIN (NITROSTAT) 0.4 MG SL tablet Place 1 tablet (0.4 mg total) under the tongue every 5 (five) minutes as needed for chest pain. 25 tablet 2  . pravastatin (PRAVACHOL) 80 MG tablet Take 80 mg by mouth daily.     No current facility-administered medications for this visit.    Allergies:  Patient has no known allergies.   Social History: The patient  reports that he has never smoked. He has never used smokeless tobacco. He reports that he does not drink alcohol or use drugs.   ROS:  Please see the history of present illness. Otherwise, complete review of systems is positive for none.  All other systems are reviewed and negative.   Physical Exam: VS:  BP 120/70   Pulse 66   Ht 5\' 9"  (1.753 m)   Wt 172 lb (78 kg)   SpO2 97%   BMI 25.40 kg/m , BMI Body mass index is 25.4 kg/m.  Wt Readings from Last 3 Encounters:  06/24/17 172 lb (78 kg)  06/08/16 172 lb (78 kg)  05/05/15 173 lb 6.4 oz (78.7 kg)    General: Patient appears comfortable at rest. HEENT: Conjunctiva and  lids normal, oropharynx clear. Neck: Supple, no elevated JVP or carotid bruits, no thyromegaly. Lungs: Clear to auscultation, nonlabored breathing at rest. Cardiac: Regular rate and rhythm, no S3 or significant systolic murmur, no pericardial rub. Abdomen: Soft, nontender, bowel sounds present. Extremities: No pitting edema, distal pulses 2+. Skin: Warm and dry. Musculoskeletal: No kyphosis. Neuropsychiatric: Alert and oriented x3, affect grossly appropriate.  ECG: I personally reviewed the tracing from 06/08/2016 which showed sinus rhythm with R' in lead V1 and V2.  Recent Labwork:  November 2018: Hemoglobin 17.4, platelets 225, BUN 21, creatinine 1.07, potassium 4.4, AST 28, ALT 26, cholesterol  155, triglycerides 76, HDL 54, LDL 86  Other Studies Reviewed Today:  Abdominal ultrasound 06/30/2016: Aortoiliac atherosclerosis without stenosis, no evidence of aortic aneurysm.  Cardiac catheterization 04/12/2014: HEMODYNAMICS:Aortic pressure was 95/62; LV pressure was 98/4; LVEDP 7. There was no gradient between the left ventricle and aorta.   ANGIOGRAPHIC DATA:The left main coronary artery is widely patent.  The left anterior descending artery is a medium-size vessel. There is moderate disease proximally. In the mid vessel, there is a 99% stenosis resulting in TIMI 2 flow in the remainder of the vessel. There are 2 medium-size diagonals, both of which are widely patent. One originates before the severe stenosis and one originates after that. Both are patent.  The left circumflex artery is a large vessel. The first obtuse marginal is a large vessel which is widely patent. The second obtuse marginal is also a large vessel which is widely patent. The remainder of the circumflex is patent.  The right coronary artery is a medium size, dominant vessel. There is moderate disease in the proximal vessel. There is mild disease in the mid vessel and a mild focal lesion in the distal vessel. The posterior lateral artery is medium-sized and patent. The posterior descending artery a small and patent.  LEFT VENTRICULOGRAM:Left ventricular angiogram was done in the 30 RAO projection and revealed normal left ventricular wall motion and systolic function with an estimated ejection fraction of 60%. LVEDP was 7 mmHg.  PCI NARRATIVE: A CLS 3.0 guiding catheters using his left main. IV heparin and IV tirofiban were used for anticoagulation. ACT was used to check that the anticoagulation was therapeutic. A fielder XT wire was placed across the area of disease in the LAD. There was difficulty in getting the fielder wire into the LAD due to an acute angle. A 2.0 x 20 balloon was used to predilate the  entire area of disease, which was a long segment. A 2.25 x 38 Synergy drug-eluting stent was deployed across the entire area disease. The stent was postdilated with a 2.5 x 15 noncompliant balloon to high pressure. The patient tolerated the procedure well. There was an excellent angiographic result.  IMPRESSIONS:  1. Normal left main coronary artery. 2. 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 This was successfully treated with a 2.25 x 38 Synergy drug-eluting stent, postdilated to 2.6 mm in diameter. 3. Widely patent left circumflex artery and its branches. 4. Mild disease in the right coronary artery. 5. Normal left ventricular systolic function. LVEDP 8 mmHg. Ejection fraction 60%.  Assessment and Plan:  1.  CAD with history of DES to the LAD in January 2016.  He is doing well from a symptom perspective on medical therapy.  We are requesting his interval ECG from Dr. Edrick Oh.  Also arrange follow-up exercise Myoview to assess ischemic burden 3 years out from intervention.  2.  Mixed hyperlipidemia, continues  on Pravachol and Zetia.  Most recent LDL 86.  We did go over his diet.  He reports compliance with medication.  3.  History of mild carotid atherosclerosis, asymptomatic.  He remains on aspirin and statin therapy.  Would plan to follow-up on carotid Dopplers next year.  4.  Aortoiliac atherosclerosis, no abdominal aortic aneurysm by imaging last year.  Current medicines were reviewed with the patient today.   Orders Placed This Encounter  Procedures  . NM Myocar Multi W/Spect W/Wall Motion / EF    Disposition: Follow-up in 1 year, sooner if needed.  Signed, Satira Sark, MD, [facility] 06/24/2017 8:59 AM    [place] at [place], [place], [place] [address] Phone: [phone]; Fax: [phone]

## 2017-06-24 ENCOUNTER — Telehealth: Payer: Self-pay | Admitting: Cardiology

## 2017-06-24 ENCOUNTER — Encounter: Payer: Self-pay | Admitting: *Deleted

## 2017-06-24 ENCOUNTER — Encounter: Payer: Self-pay | Admitting: Cardiology

## 2017-06-24 ENCOUNTER — Ambulatory Visit: Payer: Medicare Other | Admitting: Cardiology

## 2017-06-24 VITALS — BP 120/70 | HR 66 | Ht 69.0 in | Wt 172.0 lb

## 2017-06-24 DIAGNOSIS — E782 Mixed hyperlipidemia: Secondary | ICD-10-CM

## 2017-06-24 DIAGNOSIS — I700 Atherosclerosis of aorta: Secondary | ICD-10-CM

## 2017-06-24 DIAGNOSIS — I6523 Occlusion and stenosis of bilateral carotid arteries: Secondary | ICD-10-CM

## 2017-06-24 DIAGNOSIS — I25119 Atherosclerotic heart disease of native coronary artery with unspecified angina pectoris: Secondary | ICD-10-CM

## 2017-06-24 NOTE — Telephone Encounter (Signed)
Pre-cert Verification for the following procedure   Exercise stress scheduled for 07/08/2017 at [facility]

## 2017-06-24 NOTE — Patient Instructions (Signed)
Medication Instructions:   Your physician recommends that you continue on your current medications as directed. Please refer to the Current Medication list given to you today.  Labwork:  NONE  Testing/Procedures: Your physician has requested that you have en exercise stress myoview. For further information please visit [email or web address]. Please follow instruction sheet, as given.  Follow-Up:  Your physician recommends that you schedule a follow-up appointment in: 1 year. You will receive a reminder letter in the mail in about 10 months reminding you to call and schedule your appointment. If you don't receive this letter, please contact our office.  Any Other Special Instructions Will Be Listed Below (If Applicable).  If you need a refill on your cardiac medications before your next appointment, please call your pharmacy.

## 2017-07-08 ENCOUNTER — Encounter (HOSPITAL_COMMUNITY): Payer: Medicare Other

## 2017-07-27 ENCOUNTER — Encounter (HOSPITAL_COMMUNITY): Payer: Medicare Other

## 2017-07-27 ENCOUNTER — Encounter (HOSPITAL_COMMUNITY): Payer: Self-pay

## 2017-07-27 ENCOUNTER — Ambulatory Visit (HOSPITAL_COMMUNITY)
Admission: RE | Admit: 2017-07-27 | Discharge: 2017-07-27 | Disposition: A | Discharge status: Home / Self Care | Payer: Medicare Other | Source: Ambulatory Visit | Attending: Cardiology | Admitting: Cardiology

## 2017-07-27 DIAGNOSIS — I25119 Atherosclerotic heart disease of native coronary artery with unspecified angina pectoris: Secondary | ICD-10-CM | POA: Diagnosis not present

## 2017-07-27 LAB — NM MYOCAR MULTI W/SPECT W/WALL MOTION / EF
CHL CUP RESTING HR STRESS: 61 {beats}/min
CHL RATE OF PERCEIVED EXERTION: 13
Estimated workload: 15.2 METS
Exercise duration (min): 13 min
Exercise duration (sec): 2 s
LVDIAVOL: 79 mL (ref 62–150)
LVSYSVOL: 25 mL
MPHR: 152 {beats}/min
NUC STRESS TID: 0.93
Peak HR: 131 {beats}/min
Percent HR: 86 %
RATE: 0.35
SDS: 1
SRS: 0
SSS: 1

## 2017-07-27 MED ORDER — TECHNETIUM TC 99M TETROFOSMIN IV KIT
10.0000 | PACK | Freq: Once | INTRAVENOUS | Status: AC | PRN
  Administered 2017-07-27: 11 via INTRAVENOUS

## 2017-07-27 MED ORDER — SODIUM CHLORIDE 0.9% FLUSH
INTRAVENOUS | Status: AC
Start: 2017-07-27 — End: 2017-07-27
  Administered 2017-07-27: 10 mL via INTRAVENOUS
  Filled 2017-07-27: qty 10

## 2017-07-27 MED ORDER — TECHNETIUM TC 99M TETROFOSMIN IV KIT
30.0000 | PACK | Freq: Once | INTRAVENOUS | Status: AC | PRN
  Administered 2017-07-27: 30 via INTRAVENOUS

## 2017-07-27 MED ORDER — REGADENOSON 0.4 MG/5ML IV SOLN
INTRAVENOUS | Status: AC
  Filled 2017-07-27: qty 5

## 2017-07-28 ENCOUNTER — Telehealth: Payer: Self-pay

## 2017-07-28 NOTE — Telephone Encounter (Signed)
Patient notified. Routed to PCP 

## 2017-07-28 NOTE — Telephone Encounter (Signed)
-----   Message from Merlene Laughter, LPN sent at 06/30/9673 10:58 AM EDT -----   ----- Message ----- From: Satira Sark, MD Sent: 07/28/2017   8:34 AM To: Merlene Laughter, LPN  Results reviewed.  Low risk results without ischemic defects and normal LVEF.  Continue with medical therapy and current follow-up plan. A copy of this test should be forwarded to Dione Housekeeper, MD.

## 2018-06-21 ENCOUNTER — Telehealth: Payer: Self-pay | Admitting: *Deleted

## 2018-06-21 NOTE — Telephone Encounter (Signed)
Pt contacted our office. History reviewed. No symptoms to suggest any unstable cardiac conditions. Based on discussion, with current pandemic situation, we have postponed 06/27/18 appointment until 09/01/2018. If symptoms change, pt has been instructed to contact our office

## 2018-06-27 ENCOUNTER — Ambulatory Visit: Payer: Medicare Other | Admitting: Cardiology

## 2018-09-01 ENCOUNTER — Ambulatory Visit: Payer: Medicare Other | Admitting: Cardiology

## 2018-11-21 ENCOUNTER — Ambulatory Visit: Payer: Medicare Other | Admitting: Cardiology

## 2018-12-29 ENCOUNTER — Ambulatory Visit: Payer: Medicare Other | Admitting: Family Medicine

## 2019-01-02 ENCOUNTER — Ambulatory Visit: Payer: Medicare Other | Admitting: Cardiology

## 2019-01-02 ENCOUNTER — Encounter: Payer: Self-pay | Admitting: Cardiology

## 2019-01-02 ENCOUNTER — Other Ambulatory Visit: Payer: Self-pay

## 2019-01-02 VITALS — BP 122/71 | HR 66 | Temp 98.0°F | Ht 69.0 in | Wt 174.8 lb

## 2019-01-02 DIAGNOSIS — I25119 Atherosclerotic heart disease of native coronary artery with unspecified angina pectoris: Secondary | ICD-10-CM | POA: Diagnosis not present

## 2019-01-02 DIAGNOSIS — E782 Mixed hyperlipidemia: Secondary | ICD-10-CM | POA: Diagnosis not present

## 2019-01-02 DIAGNOSIS — I6523 Occlusion and stenosis of bilateral carotid arteries: Secondary | ICD-10-CM

## 2019-01-02 NOTE — Progress Notes (Signed)
Cardiology Office Note  Date: 01/02/2019   ID: Jesse Brady, DOB 12-Mar-1951, MRN [id number]  PCP:  Brady, Jesse Kaufmann, MD  Cardiologist:  Jesse Lesches, MD Electrophysiologist:  None   Chief Complaint  Patient presents with  . Cardiac follow-up    History of Present Illness: Jesse Brady is a 68 y.o. male last seen in March 2019.  He presents for a routine follow-up visit.  States that he has been doing well, no obvious angina symptoms or increasing dyspnea on exertion with typical activities.  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  Follow-up Lexiscan Myoview in May of last year was low risk as outlined below.  I went over the results with him today.  He reports compliance with his medications as outlined below.  We discussed arranging follow-up carotid Dopplers.  I reviewed his lab work from earlier in the year as outlined below.  LDL was 75.  Past Medical History:  Diagnosis Date  . Carotid artery disease [facility])    Carotid Dopplers December 2014 with 1-39% bilateral ICA stenoses  . Coronary artery disease    DES to LAD January 2016  . Elevated PSA    Biopsies negative 2009   . GERD (gastroesophageal reflux disease)   . Hyperlipidemia   . Melanoma of cheek ([place])   . Prostatic hypertrophy   . Sleep apnea    Does not use CPAP  . 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 LEFT HEART CATHETERIZATION WITH CORONARY ANGIOGRAM N/A 04/12/2014   Procedure: LEFT HEART CATHETERIZATION WITH CORONARY ANGIOGRAM;  Surgeon: Jesse Booze, MD;  Location: [facility] CATH LAB;  Service: Cardiovascular;  Laterality: N/A;  . MELANOMA EXCISION Right 2000's   cheek    Current Outpatient Medications  Medication Sig Dispense Refill  . aspirin EC 81 MG tablet Take 81 mg by mouth daily.    Marland Kitchen co-enzyme Q-10 30 MG capsule Take 30 mg by mouth daily.    Marland Kitchen ezetimibe (ZETIA) 10 MG tablet Take 1 tablet  by mouth daily.    . finasteride (PROSCAR) 5 MG tablet Take 5 mg by mouth daily.    . Multiple Vitamin (MULTIVITAMIN WITH MINERALS) TABS tablet Take 1 tablet by mouth daily.    . nitroGLYCERIN (NITROSTAT) 0.4 MG SL tablet Place 1 tablet (0.4 mg total) under the tongue every 5 (five) minutes as needed for chest pain. 25 tablet 2  . Omega-3 Fatty Acids (FISH OIL) 1000 MG CPDR Take 1 capsule by mouth daily.    . pravastatin (PRAVACHOL) 80 MG tablet Take 80 mg by mouth daily.     No current facility-administered medications for this visit.    Allergies:  Patient has no known allergies.   Social History: The patient  reports that he has never smoked. He has never used smokeless tobacco. He reports that he does not drink alcohol or use drugs.   ROS:  Please see the history of present illness. Otherwise, complete review of systems is positive for none.  All other systems are reviewed and negative.   Physical Exam: VS:  BP 122/71   Pulse 66   Temp 98 F (36.7 C)   Ht 5\' 9"  (1.753 m)   Wt 174 lb 12.8 oz (79.3 kg)   SpO2 96%   BMI 25.81 kg/m , BMI Body mass index is 25.81 kg/m.  Wt Readings from Last 3 Encounters:  01/02/19 174 lb 12.8 oz (79.3 kg)  06/24/17 172 lb (78 kg)  06/08/16 172 lb (78 kg)    General: Patient appears comfortable at rest. HEENT: Conjunctiva and lids normal, wearing a mask. Neck: Supple, no elevated JVP or carotid bruits, no thyromegaly. Lungs: Clear to auscultation, nonlabored breathing at rest. Cardiac: Regular rate and rhythm, no S3 or significant systolic murmur. Abdomen: Soft, nontender, bowel sounds present. Extremities: No pitting edema, distal pulses 2+. Skin: Warm and dry. Musculoskeletal: No kyphosis. Neuropsychiatric: Alert and oriented x3, affect grossly appropriate.  ECG:  An ECG dated 06/08/2016 was personally reviewed today and demonstrated:  Sinus rhythm with R' in lead V1 and V2.  Recent Labwork:    Component Value Date/Time   CHOL 195  03/12/2013 2327   TRIG 293 (H) 03/12/2013 2327   HDL 32 (L) 03/12/2013 2327   CHOLHDL 6.1 03/12/2013 2327   VLDL 59 (H) 03/12/2013 2327   LDLCALC 104 (H) 03/12/2013 2327  February 2020: Hemoglobin 15.9, platelets 245 January 2020: BUN 19, creatinine 0.98, potassium 4.5, AST 25, ALT 29 cholesterol 138, triglycerides 74, HDL 48, LDL 75  Other Studies Reviewed Today:  Abdominal ultrasound 06/30/2016: Aortoiliac atherosclerosis without stenosis, no evidence of aortic aneurysm.  Cardiac catheterization 04/12/2014: HEMODYNAMICS:Aortic pressure was 95/62; LV pressure was 98/4; LVEDP 7. There was no gradient between the left ventricle and aorta.   ANGIOGRAPHIC DATA:The left main coronary artery is widely patent.  The left anterior descending artery is a medium-size vessel. There is moderate disease proximally. In the mid vessel, there is a 99% stenosis resulting in TIMI 2 flow in the remainder of the vessel. There are 2 medium-size diagonals, both of which are widely patent. One originates before the severe stenosis and one originates after that. Both are patent.  The left circumflex artery is a large vessel. The first obtuse marginal is a large vessel which is widely patent. The second obtuse marginal is also a large vessel which is widely patent. The remainder of the circumflex is patent.  The right coronary artery is a medium size, dominant vessel. There is moderate disease in the proximal vessel. There is mild disease in the mid vessel and a mild focal lesion in the distal vessel. The posterior lateral artery is medium-sized and patent. The posterior descending artery a small and patent.  LEFT VENTRICULOGRAM:Left ventricular angiogram was done in the 30 RAO projection and revealed normal left ventricular wall motion and systolic function with an estimated ejection fraction of 60%. LVEDP was 7 mmHg.  PCI NARRATIVE: A CLS 3.0 guiding catheters using his left main. IV heparin and  IV tirofiban were used for anticoagulation. ACT was used to check that the anticoagulation was therapeutic. A fielder XT wire was placed across the area of disease in the LAD. There was difficulty in getting the fielder wire into the LAD due to an acute angle. A 2.0 x 20 balloon was used to predilate the entire area of disease, which was a long segment. A 2.25 x 38 Synergy drug-eluting stent was deployed across the entire area disease. The stent was postdilated with a 2.5 x 15 noncompliant balloon to high pressure. The patient tolerated the procedure well. There was an excellent angiographic result.  IMPRESSIONS:  1. Normal left main coronary artery. 2. 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 This was successfully treated with a 2.25 x 38 Synergy drug-eluting stent, postdilated to 2.6 mm in diameter. 3. Widely patent left circumflex artery and its branches. 4.  Mild disease in the right coronary artery. 5. Normal left ventricular systolic function. LVEDP 8 mmHg. Ejection fraction 60%.  Lexiscan Myoview 07/27/2017:  There was no ST segment deviation noted during stress.  Defect 1: There is a medium defect of mild severity present in the mid inferoseptal and mid inferior location. This appears to be due to soft tissue attenuation artifact. There are no significant ischemic territories.  This is a low risk study.  Nuclear stress EF: 68%.  Assessment and Plan:  1.  CAD status post DES to the LAD in January 2016.  Follow-up Myoview from last year was low risk.  He does not report any progressive angina symptoms and we will continue with medical therapy and observation for now.  2.  Mild carotid atherosclerosis by remote testing.  He remains on aspirin and statin.  Will obtain follow-up carotid Dopplers.  3.  Mixed hyperlipidemia, on Pravachol and Zetia.  No side effects on present regimen.  Last LDL was 75.  Medication Adjustments/Labs and Tests Ordered: Current  medicines are reviewed at length with the patient today.  Concerns regarding medicines are outlined above.   Tests Ordered: Orders Placed This Encounter  Procedures  . VAS US CAROTID    Medication Changes: No orders of the defined types were placed in this encounter.   Disposition:  Follow up 1 year in the [place] office.  Signed, Satira Sark, MD, [facility] 01/02/2019 3:44 PM    [facility] [place], [place], [place] [address] Phone: [phone]; Fax: [phone]

## 2019-01-02 NOTE — Patient Instructions (Addendum)
Medication Instructions:   Your physician recommends that you continue on your current medications as directed. Please refer to the Current Medication list given to you today.  Labwork:  NONE  Testing/Procedures: Your physician has requested that you have a carotid duplex. This test is an ultrasound of the carotid arteries in your neck. It looks at blood flow through these arteries that supply the brain with blood. Allow one hour for this exam. There are no restrictions or special instructions.  Follow-Up:  Your physician recommends that you schedule a follow-up appointment in: 1 year. You will receive a reminder letter in the mail in about 10 months reminding you to call and schedule your appointment. If you don't receive this letter, please contact our office.  Any Other Special Instructions Will Be Listed Below (If Applicable).  If you need a refill on your cardiac medications before your next appointment, please call your pharmacy. 

## 2019-01-25 ENCOUNTER — Other Ambulatory Visit: Payer: Self-pay

## 2019-01-26 ENCOUNTER — Encounter: Payer: Self-pay | Admitting: Family Medicine

## 2019-01-26 ENCOUNTER — Ambulatory Visit (INDEPENDENT_AMBULATORY_CARE_PROVIDER_SITE_OTHER): Payer: Medicare Other | Admitting: Family Medicine

## 2019-01-26 VITALS — BP 127/71 | HR 66 | Temp 97.1°F | Ht 69.0 in | Wt 175.2 lb

## 2019-01-26 DIAGNOSIS — L719 Rosacea, unspecified: Secondary | ICD-10-CM

## 2019-01-26 DIAGNOSIS — Z23 Encounter for immunization: Secondary | ICD-10-CM

## 2019-01-26 DIAGNOSIS — L209 Atopic dermatitis, unspecified: Secondary | ICD-10-CM | POA: Insufficient documentation

## 2019-01-26 DIAGNOSIS — Z8546 Personal history of malignant neoplasm of prostate: Secondary | ICD-10-CM | POA: Insufficient documentation

## 2019-01-26 DIAGNOSIS — E782 Mixed hyperlipidemia: Secondary | ICD-10-CM

## 2019-01-26 DIAGNOSIS — Z85820 Personal history of malignant melanoma of skin: Secondary | ICD-10-CM

## 2019-01-26 DIAGNOSIS — L2084 Intrinsic (allergic) eczema: Secondary | ICD-10-CM

## 2019-01-26 DIAGNOSIS — N400 Enlarged prostate without lower urinary tract symptoms: Secondary | ICD-10-CM

## 2019-01-26 MED ORDER — EZETIMIBE 10 MG PO TABS: 10.0000 mg | ORAL_TABLET | Freq: Every day | ORAL | 3 refills | Status: DC

## 2019-01-26 MED ORDER — TRIAMCINOLONE ACETONIDE 0.1 % EX CREA: 1.0000 "application " | TOPICAL_CREAM | Freq: Two times a day (BID) | CUTANEOUS | 3 refills | Status: DC

## 2019-01-26 MED ORDER — METRONIDAZOLE 1 % EX GEL: Freq: Every day | CUTANEOUS | 0 refills | Status: AC

## 2019-01-26 MED ORDER — PRAVASTATIN SODIUM 80 MG PO TABS: 80.0000 mg | ORAL_TABLET | Freq: Every day | ORAL | 3 refills | Status: DC

## 2019-01-26 MED ORDER — FINASTERIDE 5 MG PO TABS: 5.0000 mg | ORAL_TABLET | Freq: Every day | ORAL | 3 refills | Status: DC

## 2019-01-26 NOTE — Addendum Note (Signed)
Addended by: Nigel Berthold C on: 01/26/2019 09:51 AM   Modules accepted: Orders

## 2019-01-26 NOTE — Progress Notes (Signed)
BP 127/71    Pulse 66    Temp (!) 97.1 F (36.2 C) (Temporal)    Ht '5\' 9"'$  (1.753 m)    Wt 175 lb 3.2 oz (79.5 kg)    SpO2 98%    BMI 25.87 kg/m    Subjective:    Patient ID: Jesse Brady, male    DOB: 1950-09-16, 68 y.o.   MRN: [id number]  HPI: Jesse Brady is a 68 y.o. male presenting on 01/26/2019 for New Patient (Initial Visit) ([place]) and Establish Care   HPI Patient has a history of prostate hypertrophy and history of one-time biopsy that may have had 1 or 2 spots with prostate cancer that they have been monitoring but has not been a recurring issue.  We are monitoring PSAs for now  Hyperlipidemia with history of CAD and stent Patient is coming in for recheck of his hyperlipidemia. The patient is currently taking pravastatin and fish oil and Zetia, he does have a cardiologist Dr. Domenic Polite. They deny any issues with myalgias or history of liver damage from it. They deny any focal numbness or weakness or chest pain.    Patient did have a history of melanoma previously and has a dermatologist that he sees  Patient gets a recurring rash on his legs and specifically his ankles that he saw dermatology for and they said it was kind of like eczema and he has been using a cream for that but does not have it does not recall what it is  Patient also has a rash on his face that is small spots and red on both cheeks and sometimes elevated and sometimes swollen but never painful or itchy.  He cannot recall what the dermatology treated him for with that previously  Relevant past medical, surgical, family and social history reviewed and updated as indicated. Interim medical history since our last visit reviewed. Allergies and medications reviewed and updated.  Review of Systems  Constitutional: Negative for chills and fever.  Respiratory: Negative for shortness of breath and wheezing.   Cardiovascular: Negative for chest pain and leg swelling.  Musculoskeletal: Negative for back pain  and gait problem.  Skin: Positive for color change and rash.  All other systems reviewed and are negative.   Per HPI unless specifically indicated above  Social History   Socioeconomic History   Marital status: Married    Spouse name: Jesse Brady   Number of children: 2   Years of education: Not on file   Highest education level: Not on file  Occupational History   Not on file  Social Needs   Financial resource strain: Not on file   Food insecurity    Worry: Not on file    Inability: Not on file   Transportation needs    Medical: Not on file    Non-medical: Not on file  Tobacco Use   Smoking status: Never Smoker   Smokeless tobacco: Never Used  Substance and Sexual Activity   Alcohol use: No    Alcohol/week: 0.0 standard drinks   Drug use: No   Sexual activity: Yes  Lifestyle   Physical activity    Days per week: Not on file    Minutes per session: Not on file   Stress: Not on file  Relationships   Social connections    Talks on phone: Not on file    Gets together: Not on file    Attends religious service: Not on file    Active member of  club or organization: Not on file    Attends meetings of clubs or organizations: Not on file    Relationship status: Not on file   Intimate partner violence    Fear of current or ex partner: Not on file    Emotionally abused: Not on file    Physically abused: Not on file    Forced sexual activity: Not on file  Other Topics Concern   Not on file  Social History Narrative   Not on file    Past Surgical History:  Procedure Laterality Date   LEFT HEART CATHETERIZATION WITH CORONARY ANGIOGRAM N/A 04/12/2014   Procedure: [place];  Surgeon: Jettie Booze, MD;  Location: [facility] C[facility] LAB;  Service: Cardiovascular;  Laterality: N/A;   MELANOMA EXCISION Right 2000's   cheek    Family History  Problem Relation Age of Onset   Hypertension Father    CAD Father         Premature disease   AAA (abdominal aortic aneurysm) Father        Died in his 5s    Allergies as of 01/26/2019   No Known Allergies     Medication List       Accurate as of January 26, 2019  9:18 AM. If you have any questions, ask your nurse or doctor.        aspirin EC 81 MG tablet Take 81 mg by mouth daily.   co-enzyme Q-10 30 MG capsule Take 30 mg by mouth daily.   ezetimibe 10 MG tablet Commonly known as: Zetia Take 1 tablet (10 mg total) by mouth daily.   finasteride 5 MG tablet Commonly known as: PROSCAR Take 1 tablet (5 mg total) by mouth daily.   Fish Oil 1000 MG Cpdr Take 1 capsule by mouth daily.   metroNIDAZOLE 1 % gel Commonly known as: Metrogel Apply topically daily. Started by: Worthy Rancher, MD   multivitamin with minerals Tabs tablet Take 1 tablet by mouth daily.   nitroGLYCERIN 0.4 MG SL tablet Commonly known as: Nitrostat Place 1 tablet (0.4 mg total) under the tongue every 5 (five) minutes as needed for chest pain.   pravastatin 80 MG tablet Commonly known as: PRAVACHOL Take 1 tablet (80 mg total) by mouth daily.   triamcinolone cream 0.1 % Commonly known as: KENALOG Apply 1 application topically 2 (two) times daily. Started by: Fransisca Kaufmann Francesca Strome, MD          Objective:    BP 127/71    Pulse 66    Temp (!) 97.1 F (36.2 C) (Temporal)    Ht '5\' 9"'$  (1.753 m)    Wt 175 lb 3.2 oz (79.5 kg)    SpO2 98%    BMI 25.87 kg/m   Wt Readings from Last 3 Encounters:  01/26/19 175 lb 3.2 oz (79.5 kg)  01/02/19 174 lb 12.8 oz (79.3 kg)  06/24/17 172 lb (78 kg)    Physical Exam Vitals signs and nursing note reviewed.  Constitutional:      General: He is not in acute distress.    Appearance: He is well-developed. He is not diaphoretic.  Eyes:     General: No scleral icterus.    Conjunctiva/sclera: Conjunctivae normal.  Neck:     Musculoskeletal: Neck supple.     Thyroid: No thyromegaly.  Cardiovascular:     Rate and Rhythm:  Normal rate and regular rhythm.     Heart sounds: Normal heart sounds. No  murmur.  Pulmonary:     Effort: Pulmonary effort is normal. No respiratory distress.     Breath sounds: Normal breath sounds. No wheezing.  Musculoskeletal: Normal range of motion.  Lymphadenopathy:     Cervical: No cervical adenopathy.  Skin:    General: Skin is warm and dry.     Findings: Rash (Rash on face consistent with rosacea, too small's blotches of it on either cheek) present.       Neurological:     Mental Status: He is alert and oriented to person, place, and time.     Coordination: Coordination normal.  Psychiatric:        Behavior: Behavior normal.         Assessment & Plan:   Problem List Items Addressed This Visit      Musculoskeletal and Integument   Atopic dermatitis   Relevant Medications   triamcinolone cream (KENALOG) 0.1 %     Other   Hyperlipidemia - Primary   Relevant Medications   ezetimibe (ZETIA) 10 MG tablet   pravastatin (PRAVACHOL) 80 MG tablet   Other Relevant Orders   CBC with Differential/Platelet   Lipid panel   Prostate hypertrophy   History of prostate cancer   Relevant Medications   finasteride (PROSCAR) 5 MG tablet   Other Relevant Orders   CBC with Differential/Platelet   PSA, total and free   History of melanoma    Other Visit Diagnoses    Rosacea       Relevant Medications   metroNIDAZOLE (METROGEL) 1 % gel   Other Relevant Orders   CMP14+EGFR      Will treat for rosacea and atopic dermatitis and gave metronidazole gel and triamcinolone respectively. Continue patient's finasteride pravastatin and Zetia and will do blood work, he will come back another day fasting for the blood work Follow up plan: Return in about 6 months (around 07/27/2019), or if symptoms worsen or fail to improve, for physical and blood work.  Caryl Pina, MD [facility] 01/26/2019, 9:18 AM

## 2019-01-29 ENCOUNTER — Other Ambulatory Visit: Payer: Self-pay

## 2019-01-29 ENCOUNTER — Other Ambulatory Visit: Payer: Medicare Other

## 2019-01-29 DIAGNOSIS — Z8546 Personal history of malignant neoplasm of prostate: Secondary | ICD-10-CM

## 2019-01-29 DIAGNOSIS — E782 Mixed hyperlipidemia: Secondary | ICD-10-CM

## 2019-01-29 DIAGNOSIS — L719 Rosacea, unspecified: Secondary | ICD-10-CM | POA: Diagnosis not present

## 2019-01-30 LAB — CMP14+EGFR
ALT: 23 IU/L (ref 0–44)
AST: 20 IU/L (ref 0–40)
Albumin/Globulin Ratio: 1.6 (ref 1.2–2.2)
Albumin: 4.2 g/dL (ref 3.8–4.8)
Alkaline Phosphatase: 69 IU/L (ref 39–117)
BUN/Creatinine Ratio: 20 (ref 10–24)
BUN: 21 mg/dL (ref 8–27)
Bilirubin Total: 0.6 mg/dL (ref 0.0–1.2)
CO2: 25 mmol/L (ref 20–29)
Calcium: 9.1 mg/dL (ref 8.6–10.2)
Chloride: 103 mmol/L (ref 96–106)
Creatinine, Ser: 1.06 mg/dL (ref 0.76–1.27)
GFR calc Af Amer: 82 mL/min/{1.73_m2} (ref >59)
GFR calc non Af Amer: 71 mL/min/{1.73_m2} (ref >59)
Globulin, Total: 2.6 g/dL (ref 1.5–4.5)
Glucose: 98 mg/dL (ref 65–99)
Potassium: 4.4 mmol/L (ref 3.5–5.2)
Sodium: 139 mmol/L (ref 134–144)
Total Protein: 6.8 g/dL (ref 6.0–8.5)

## 2019-01-30 LAB — LIPID PANEL
Chol/HDL Ratio: 3 ratio (ref 0.0–5.0)
Cholesterol, Total: 134 mg/dL (ref 100–199)
HDL: 44 mg/dL (ref >39)
LDL Chol Calc (NIH): 75 mg/dL (ref 0–99)
Triglycerides: 72 mg/dL (ref 0–149)
VLDL Cholesterol Cal: 15 mg/dL (ref 5–40)

## 2019-01-30 LAB — CBC WITH DIFFERENTIAL/PLATELET
Basophils Absolute: 0 10*3/uL (ref 0.0–0.2)
Basos: 1 %
EOS (ABSOLUTE): 0.2 10*3/uL (ref 0.0–0.4)
Eos: 3 %
Hematocrit: 47 % (ref 37.5–51.0)
Hemoglobin: 15.9 g/dL (ref 13.0–17.7)
Immature Grans (Abs): 0 10*3/uL (ref 0.0–0.1)
Immature Granulocytes: 0 %
Lymphocytes Absolute: 1.2 10*3/uL (ref 0.7–3.1)
Lymphs: 20 %
MCH: 30.5 pg (ref 26.6–33.0)
MCHC: 33.8 g/dL (ref 31.5–35.7)
MCV: 90 fL (ref 79–97)
Monocytes Absolute: 0.7 10*3/uL (ref 0.1–0.9)
Monocytes: 11 %
Neutrophils Absolute: 4.2 10*3/uL (ref 1.4–7.0)
Neutrophils: 65 %
Platelets: 211 10*3/uL (ref 150–450)
RBC: 5.21 x10E6/uL (ref 4.14–5.80)
RDW: 12.5 % (ref 11.6–15.4)
WBC: 6.3 10*3/uL (ref 3.4–10.8)

## 2019-01-30 LAB — PSA, TOTAL AND FREE
PSA, Free Pct: 21.3 %
PSA, Free: 0.34 ng/mL
Prostate Specific Ag, Serum: 1.6 ng/mL (ref 0.0–4.0)

## 2019-02-01 ENCOUNTER — Other Ambulatory Visit: Payer: Self-pay

## 2019-02-01 ENCOUNTER — Ambulatory Visit (INDEPENDENT_AMBULATORY_CARE_PROVIDER_SITE_OTHER): Payer: Medicare Other

## 2019-02-01 DIAGNOSIS — I6523 Occlusion and stenosis of bilateral carotid arteries: Secondary | ICD-10-CM | POA: Diagnosis not present

## 2019-02-06 ENCOUNTER — Telehealth: Payer: Self-pay | Admitting: *Deleted

## 2019-02-06 NOTE — Telephone Encounter (Signed)
Patient informed. Copy sent to PCP °

## 2019-02-06 NOTE — Telephone Encounter (Signed)
-----   Message from Satira Sark, MD sent at 02/01/2019 10:35 AM EST ----- Results reviewed.  It looks like this could possibly be a pulmonary report, however does not suggest any significant progression in atherosclerosis within bilateral ICAs.  Continue with medical therapy.

## 2019-02-27 IMAGING — NM NM MYOCAR MULTI W/SPECT W/WALL MOTION & EF
2 series · 12 of 12 positions shown · non-contrast
Comparison: none

[Series 1: rest · 6.51mm/px · 6 of 64 frames shown]
[frame 6/64]
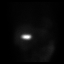
[frame 16/64]
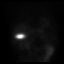
[frame 27/64]
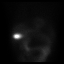
[frame 38/64]
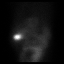
[frame 48/64]
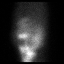
[frame 59/64]
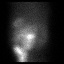

[Series 3: stress gated - perfusion · 6.51mm/px · 6 of 64 frames shown]
[frame 6/64]
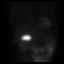
[frame 16/64]
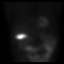
[frame 27/64]
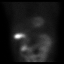
[frame 38/64]
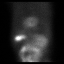
[frame 48/64]
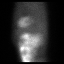
[frame 59/64]
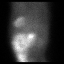

[12 of 12 positions shown; findings below may reference images not displayed]

Canned report from images found in remote index.

Refer to host system for actual result text.

## 2019-06-28 DIAGNOSIS — H43813 Vitreous degeneration, bilateral: Secondary | ICD-10-CM | POA: Diagnosis not present

## 2019-06-28 DIAGNOSIS — H3589 Other specified retinal disorders: Secondary | ICD-10-CM | POA: Diagnosis not present

## 2019-06-28 DIAGNOSIS — H3561 Retinal hemorrhage, right eye: Secondary | ICD-10-CM | POA: Diagnosis not present

## 2019-06-28 DIAGNOSIS — H2513 Age-related nuclear cataract, bilateral: Secondary | ICD-10-CM | POA: Diagnosis not present

## 2019-06-28 DIAGNOSIS — H4311 Vitreous hemorrhage, right eye: Secondary | ICD-10-CM | POA: Diagnosis not present

## 2019-07-04 DIAGNOSIS — H4311 Vitreous hemorrhage, right eye: Secondary | ICD-10-CM | POA: Diagnosis not present

## 2019-07-04 DIAGNOSIS — H43813 Vitreous degeneration, bilateral: Secondary | ICD-10-CM | POA: Diagnosis not present

## 2019-07-04 DIAGNOSIS — H3561 Retinal hemorrhage, right eye: Secondary | ICD-10-CM | POA: Diagnosis not present

## 2019-07-04 DIAGNOSIS — H3589 Other specified retinal disorders: Secondary | ICD-10-CM | POA: Diagnosis not present

## 2019-07-25 DIAGNOSIS — H4311 Vitreous hemorrhage, right eye: Secondary | ICD-10-CM | POA: Diagnosis not present

## 2019-07-25 DIAGNOSIS — H43813 Vitreous degeneration, bilateral: Secondary | ICD-10-CM | POA: Diagnosis not present

## 2019-07-25 DIAGNOSIS — H3589 Other specified retinal disorders: Secondary | ICD-10-CM | POA: Diagnosis not present

## 2019-07-25 DIAGNOSIS — H3561 Retinal hemorrhage, right eye: Secondary | ICD-10-CM | POA: Diagnosis not present

## 2019-07-27 ENCOUNTER — Ambulatory Visit: Payer: Medicare Other | Admitting: Family Medicine

## 2019-08-07 ENCOUNTER — Telehealth: Payer: Self-pay | Admitting: Family Medicine

## 2019-08-07 DIAGNOSIS — E782 Mixed hyperlipidemia: Secondary | ICD-10-CM

## 2019-08-07 NOTE — Telephone Encounter (Signed)
Pt made aware. He will come in the morning around 8am due to fasting.

## 2019-08-07 NOTE — Telephone Encounter (Signed)
Had last labs in November. Please advise on what orders needed?

## 2019-08-07 NOTE — Telephone Encounter (Signed)
Placed lab orders for the patient 

## 2019-08-08 ENCOUNTER — Other Ambulatory Visit: Payer: Medicare Other

## 2019-08-08 ENCOUNTER — Other Ambulatory Visit: Payer: Self-pay

## 2019-08-08 DIAGNOSIS — E782 Mixed hyperlipidemia: Secondary | ICD-10-CM | POA: Diagnosis not present

## 2019-08-08 LAB — CMP14+EGFR
ALT: 26 IU/L (ref 0–44)
AST: 24 IU/L (ref 0–40)
Albumin/Globulin Ratio: 2 (ref 1.2–2.2)
Albumin: 4.6 g/dL (ref 3.8–4.8)
Alkaline Phosphatase: 69 IU/L (ref 39–117)
BUN/Creatinine Ratio: 17 (ref 10–24)
BUN: 19 mg/dL (ref 8–27)
Bilirubin Total: 0.6 mg/dL (ref 0.0–1.2)
CO2: 24 mmol/L (ref 20–29)
Calcium: 9.5 mg/dL (ref 8.6–10.2)
Chloride: 104 mmol/L (ref 96–106)
Creatinine, Ser: 1.15 mg/dL (ref 0.76–1.27)
GFR calc Af Amer: 74 mL/min/{1.73_m2} (ref >59)
GFR calc non Af Amer: 64 mL/min/{1.73_m2} (ref >59)
Globulin, Total: 2.3 g/dL (ref 1.5–4.5)
Glucose: 97 mg/dL (ref 65–99)
Potassium: 4.9 mmol/L (ref 3.5–5.2)
Sodium: 142 mmol/L (ref 134–144)
Total Protein: 6.9 g/dL (ref 6.0–8.5)

## 2019-08-08 LAB — LIPID PANEL
Chol/HDL Ratio: 3.3 ratio (ref 0.0–5.0)
Cholesterol, Total: 161 mg/dL (ref 100–199)
HDL: 49 mg/dL
LDL Chol Calc (NIH): 96 mg/dL (ref 0–99)
Triglycerides: 85 mg/dL (ref 0–149)
VLDL Cholesterol Cal: 16 mg/dL (ref 5–40)

## 2019-08-10 ENCOUNTER — Encounter: Payer: Self-pay | Admitting: Family Medicine

## 2019-08-10 ENCOUNTER — Other Ambulatory Visit: Payer: Self-pay

## 2019-08-10 ENCOUNTER — Ambulatory Visit (INDEPENDENT_AMBULATORY_CARE_PROVIDER_SITE_OTHER): Payer: Medicare Other | Admitting: Family Medicine

## 2019-08-10 VITALS — BP 121/67 | HR 68 | Temp 97.3°F | Ht 68.0 in | Wt 176.0 lb

## 2019-08-10 DIAGNOSIS — I6523 Occlusion and stenosis of bilateral carotid arteries: Secondary | ICD-10-CM

## 2019-08-10 DIAGNOSIS — N400 Enlarged prostate without lower urinary tract symptoms: Secondary | ICD-10-CM | POA: Diagnosis not present

## 2019-08-10 DIAGNOSIS — E782 Mixed hyperlipidemia: Secondary | ICD-10-CM | POA: Diagnosis not present

## 2019-08-10 MED ORDER — SILDENAFIL CITRATE 20 MG PO TABS: 20.0000 mg | ORAL_TABLET | ORAL | 1 refills | Status: DC | PRN

## 2019-08-10 NOTE — Progress Notes (Signed)
BP 121/67   Pulse 68   Temp (!) 97.3 F (36.3 C)   Ht '5\' 8"'$  (1.727 m)   Wt 176 lb (79.8 kg)   SpO2 97%   BMI 26.76 kg/m    Subjective:   Patient ID: Jesse Brady, male    DOB: May 16, 1950, 69 y.o.   MRN: [id number]  HPI: Jesse GANGEMI is a 69 y.o. male presenting on 08/10/2019 for Medical Management of Chronic Issues (25m and Hyperlipidemia   HPI Hyperlipidemia with history of carotid artery disease Patient is coming in for recheck of his hyperlipidemia. The patient is currently taking pravastatin and Zetia, lipids have come up just slightly and his LDL is 96. They deny any issues with myalgias or history of liver damage from it. They deny any focal numbness or weakness or chest pain.   History of prostate cancer and enlarged prostate Patient is coming in for recheck on BPH Symptoms: None currently, takes finasteride Medication: Finasteride Last PSA: 1.6 in November 2020  Relevant past medical, surgical, family and social history reviewed and updated as indicated. Interim medical history since our last visit reviewed. Allergies and medications reviewed and updated.  Review of Systems  Constitutional: Negative for chills and fever.  Eyes: Negative for discharge.  Respiratory: Negative for shortness of breath and wheezing.   Cardiovascular: Negative for chest pain and leg swelling.  Musculoskeletal: Negative for back pain and gait problem.  Skin: Negative for rash.  Neurological: Negative for dizziness, weakness and numbness.  All other systems reviewed and are negative.   Per HPI unless specifically indicated above   Allergies as of 08/10/2019   No Known Allergies     Medication List       Accurate as of Aug 10, 2019  9:40 AM. If you have any questions, ask your nurse or doctor.        STOP taking these medications   triamcinolone cream 0.1 % Commonly known as: KENALOG Stopped by: JFransisca KaufmannDettinger, MD     TAKE these medications   aspirin EC 81 MG  tablet Take 81 mg by mouth daily.   co-enzyme Q-10 30 MG capsule Take 30 mg by mouth daily.   ezetimibe 10 MG tablet Commonly known as: Zetia Take 1 tablet (10 mg total) by mouth daily.   finasteride 5 MG tablet Commonly known as: PROSCAR Take 1 tablet (5 mg total) by mouth daily.   Fish Oil 1000 MG Cpdr Take 1 capsule by mouth daily.   metroNIDAZOLE 1 % gel Commonly known as: Metrogel Apply topically daily.   multivitamin with minerals Tabs tablet Take 1 tablet by mouth daily.   nitroGLYCERIN 0.4 MG SL tablet Commonly known as: Nitrostat Place 1 tablet (0.4 mg total) under the tongue every 5 (five) minutes as needed for chest pain.   pravastatin 80 MG tablet Commonly known as: PRAVACHOL Take 1 tablet (80 mg total) by mouth daily.        Objective:   BP 121/67   Pulse 68   Temp (!) 97.3 F (36.3 C)   Ht '5\' 8"'$  (1.727 m)   Wt 176 lb (79.8 kg)   SpO2 97%   BMI 26.76 kg/m   Wt Readings from Last 3 Encounters:  08/10/19 176 lb (79.8 kg)  01/26/19 175 lb 3.2 oz (79.5 kg)  01/02/19 174 lb 12.8 oz (79.3 kg)    Physical Exam Vitals and nursing note reviewed.  Constitutional:      General: He is  not in acute distress.    Appearance: He is well-developed. He is not diaphoretic.  Eyes:     General: No scleral icterus.       Right eye: No discharge.     Conjunctiva/sclera: Conjunctivae normal.     Pupils: Pupils are equal, round, and reactive to light.  Neck:     Thyroid: No thyromegaly.  Cardiovascular:     Rate and Rhythm: Normal rate and regular rhythm.     Heart sounds: Normal heart sounds. No murmur.  Pulmonary:     Effort: Pulmonary effort is normal. No respiratory distress.     Breath sounds: Normal breath sounds. No wheezing.  Musculoskeletal:        General: Normal range of motion.     Cervical back: Neck supple.  Lymphadenopathy:     Cervical: No cervical adenopathy.  Skin:    General: Skin is warm and dry.     Findings: No rash.    Neurological:     Mental Status: He is alert and oriented to person, place, and time.     Coordination: Coordination normal.  Psychiatric:        Behavior: Behavior normal.     Results for orders placed or performed in visit on 08/08/19  Lipid panel  Result Value Ref Range   Cholesterol, Total 161 100 - 199 mg/dL   Triglycerides 85 0 - 149 mg/dL   HDL 49 >39 mg/dL   VLDL Cholesterol Cal 16 5 - 40 mg/dL   LDL Chol Calc (NIH) 96 0 - 99 mg/dL   Chol/HDL Ratio 3.3 0.0 - 5.0 ratio  CMP14+EGFR  Result Value Ref Range   Glucose 97 65 - 99 mg/dL   BUN 19 8 - 27 mg/dL   Creatinine, Ser 1.15 0.76 - 1.27 mg/dL   GFR calc non Af Amer 64 >59 mL/min/1.73   GFR calc Af Amer 74 >59 mL/min/1.73   BUN/Creatinine Ratio 17 10 - 24   Sodium 142 134 - 144 mmol/L   Potassium 4.9 3.5 - 5.2 mmol/L   Chloride 104 96 - 106 mmol/L   CO2 24 20 - 29 mmol/L   Calcium 9.5 8.6 - 10.2 mg/dL   Total Protein 6.9 6.0 - 8.5 g/dL   Albumin 4.6 3.8 - 4.8 g/dL   Globulin, Total 2.3 1.5 - 4.5 g/dL   Albumin/Globulin Ratio 2.0 1.2 - 2.2   Bilirubin Total 0.6 0.0 - 1.2 mg/dL   Alkaline Phosphatase 69 39 - 117 IU/L   AST 24 0 - 40 IU/L   ALT 26 0 - 44 IU/L    Assessment & Plan:   Problem List Items Addressed This Visit      Cardiovascular and Mediastinum   Carotid artery disease (HCC)   Relevant Medications   sildenafil (REVATIO) 20 MG tablet     Other   Hyperlipidemia   Relevant Medications   sildenafil (REVATIO) 20 MG tablet   Prostate hypertrophy - Primary      Recommend for patient to discuss with cardiologist Dr. Domenic Polite to consider possibly Nexletol or Repatha in future. Follow up plan: Return in about 6 months (around 02/10/2020), or if symptoms worsen or fail to improve, for Fasting labs and physical.  Counseling provided for all of the vaccine components No orders of the defined types were placed in this encounter.   Caryl Pina, MD [facility] 08/10/2019,  9:40 AM

## 2019-08-10 NOTE — Patient Instructions (Signed)
Consider possibly Repatha, injectable or Nexletol, tablet for cholesterol

## 2019-09-26 DIAGNOSIS — H2513 Age-related nuclear cataract, bilateral: Secondary | ICD-10-CM | POA: Diagnosis not present

## 2019-11-28 DIAGNOSIS — L821 Other seborrheic keratosis: Secondary | ICD-10-CM | POA: Diagnosis not present

## 2019-11-28 DIAGNOSIS — L814 Other melanin hyperpigmentation: Secondary | ICD-10-CM | POA: Diagnosis not present

## 2019-11-28 DIAGNOSIS — Z85820 Personal history of malignant melanoma of skin: Secondary | ICD-10-CM | POA: Diagnosis not present

## 2019-11-28 DIAGNOSIS — Z85828 Personal history of other malignant neoplasm of skin: Secondary | ICD-10-CM | POA: Diagnosis not present

## 2019-11-28 DIAGNOSIS — L570 Actinic keratosis: Secondary | ICD-10-CM | POA: Diagnosis not present

## 2020-01-13 ENCOUNTER — Other Ambulatory Visit: Payer: Self-pay | Admitting: Family Medicine

## 2020-01-13 DIAGNOSIS — Z8546 Personal history of malignant neoplasm of prostate: Secondary | ICD-10-CM

## 2020-01-13 DIAGNOSIS — E782 Mixed hyperlipidemia: Secondary | ICD-10-CM

## 2020-02-28 ENCOUNTER — Encounter: Payer: Medicare Other | Admitting: Family Medicine

## 2020-03-04 ENCOUNTER — Other Ambulatory Visit: Payer: Medicare Other

## 2020-03-04 ENCOUNTER — Other Ambulatory Visit: Payer: Self-pay

## 2020-03-04 DIAGNOSIS — E782 Mixed hyperlipidemia: Secondary | ICD-10-CM | POA: Diagnosis not present

## 2020-03-04 DIAGNOSIS — N400 Enlarged prostate without lower urinary tract symptoms: Secondary | ICD-10-CM

## 2020-03-05 LAB — CMP14+EGFR
ALT: 26 IU/L (ref 0–44)
AST: 27 IU/L (ref 0–40)
Albumin/Globulin Ratio: 2 (ref 1.2–2.2)
Albumin: 4.7 g/dL (ref 3.7–4.7)
Alkaline Phosphatase: 69 IU/L (ref 44–121)
BUN/Creatinine Ratio: 15 (ref 10–24)
BUN: 20 mg/dL (ref 8–27)
Bilirubin Total: 0.6 mg/dL (ref 0.0–1.2)
CO2: 22 mmol/L (ref 20–29)
Calcium: 9.7 mg/dL (ref 8.6–10.2)
Chloride: 102 mmol/L (ref 96–106)
Creatinine, Ser: 1.3 mg/dL — ABNORMAL HIGH (ref 0.76–1.27)
GFR calc Af Amer: 63 mL/min/{1.73_m2} (ref >59)
GFR calc non Af Amer: 55 mL/min/{1.73_m2} — ABNORMAL LOW (ref >59)
Globulin, Total: 2.4 g/dL (ref 1.5–4.5)
Glucose: 94 mg/dL (ref 65–99)
Potassium: 4.4 mmol/L (ref 3.5–5.2)
Sodium: 138 mmol/L (ref 134–144)
Total Protein: 7.1 g/dL (ref 6.0–8.5)

## 2020-03-05 LAB — LIPID PANEL
Chol/HDL Ratio: 3.8 ratio (ref 0.0–5.0)
Cholesterol, Total: 176 mg/dL (ref 100–199)
HDL: 46 mg/dL (ref >39)
LDL Chol Calc (NIH): 111 mg/dL — ABNORMAL HIGH (ref 0–99)
Triglycerides: 105 mg/dL (ref 0–149)
VLDL Cholesterol Cal: 19 mg/dL (ref 5–40)

## 2020-03-05 LAB — CBC WITH DIFFERENTIAL/PLATELET
Basophils Absolute: 0 10*3/uL (ref 0.0–0.2)
Basos: 0 %
EOS (ABSOLUTE): 0.2 10*3/uL (ref 0.0–0.4)
Eos: 3 %
Hematocrit: 47.6 % (ref 37.5–51.0)
Hemoglobin: 16.7 g/dL (ref 13.0–17.7)
Immature Grans (Abs): 0 10*3/uL (ref 0.0–0.1)
Immature Granulocytes: 0 %
Lymphocytes Absolute: 1.6 10*3/uL (ref 0.7–3.1)
Lymphs: 23 %
MCH: 31.2 pg (ref 26.6–33.0)
MCHC: 35.1 g/dL (ref 31.5–35.7)
MCV: 89 fL (ref 79–97)
Monocytes Absolute: 0.6 10*3/uL (ref 0.1–0.9)
Monocytes: 9 %
Neutrophils Absolute: 4.4 10*3/uL (ref 1.4–7.0)
Neutrophils: 65 %
Platelets: 228 10*3/uL (ref 150–450)
RBC: 5.35 x10E6/uL (ref 4.14–5.80)
RDW: 12.2 % (ref 11.6–15.4)
WBC: 6.8 10*3/uL (ref 3.4–10.8)

## 2020-03-05 LAB — PSA, TOTAL AND FREE
PSA, Free Pct: 21.3 %
PSA, Free: 0.34 ng/mL
Prostate Specific Ag, Serum: 1.6 ng/mL (ref 0.0–4.0)

## 2020-03-06 ENCOUNTER — Other Ambulatory Visit: Payer: Self-pay

## 2020-03-06 ENCOUNTER — Ambulatory Visit (INDEPENDENT_AMBULATORY_CARE_PROVIDER_SITE_OTHER): Payer: Medicare Other | Admitting: Family Medicine

## 2020-03-06 ENCOUNTER — Encounter: Payer: Self-pay | Admitting: Family Medicine

## 2020-03-06 ENCOUNTER — Telehealth: Payer: Self-pay | Admitting: *Deleted

## 2020-03-06 VITALS — BP 136/78 | HR 64 | Ht 68.0 in | Wt 177.0 lb

## 2020-03-06 DIAGNOSIS — Z8546 Personal history of malignant neoplasm of prostate: Secondary | ICD-10-CM

## 2020-03-06 DIAGNOSIS — Z0000 Encounter for general adult medical examination without abnormal findings: Secondary | ICD-10-CM

## 2020-03-06 DIAGNOSIS — N400 Enlarged prostate without lower urinary tract symptoms: Secondary | ICD-10-CM

## 2020-03-06 DIAGNOSIS — E782 Mixed hyperlipidemia: Secondary | ICD-10-CM | POA: Diagnosis not present

## 2020-03-06 DIAGNOSIS — Z0001 Encounter for general adult medical examination with abnormal findings: Secondary | ICD-10-CM | POA: Diagnosis not present

## 2020-03-06 DIAGNOSIS — Z23 Encounter for immunization: Secondary | ICD-10-CM | POA: Diagnosis not present

## 2020-03-06 MED ORDER — PRAVASTATIN SODIUM 80 MG PO TABS: 80.0000 mg | ORAL_TABLET | Freq: Every day | ORAL | 3 refills | Status: DC

## 2020-03-06 MED ORDER — FINASTERIDE 5 MG PO TABS: 5.0000 mg | ORAL_TABLET | Freq: Every day | ORAL | 3 refills | Status: DC

## 2020-03-06 MED ORDER — SILDENAFIL CITRATE 20 MG PO TABS
20.0000 mg | ORAL_TABLET | ORAL | 1 refills | Status: DC | PRN
Start: 2020-03-06 — End: 2020-03-07

## 2020-03-06 MED ORDER — EZETIMIBE 10 MG PO TABS: 10.0000 mg | ORAL_TABLET | Freq: Every day | ORAL | 3 refills | Status: DC

## 2020-03-06 NOTE — Progress Notes (Signed)
BP 136/78   Pulse 64   Ht 5\' 8"  (1.727 m)   Wt 177 lb (80.3 kg)   BMI 26.91 kg/m    Subjective:   Patient ID: Jesse Brady, male    DOB: 10-24-1950, 69 y.o.   MRN: [id number]  HPI: Jesse Brady is a 69 y.o. male presenting on 03/06/2020 for Medical Management of Chronic Issues and Hyperlipidemia   HPI  Adult well exam and physical Patient is coming in today for adult well exam and physical. He denies any major health issues.  Prostate cancer and prostate hypertrophy recheck Patient is coming in for recheck on BPH Symptoms: None Medication: Finasteride Last PSA: 1.6 on 01/29/2019  Hyperlipidemia Patient is coming in for recheck of his hyperlipidemia. The patient is currently taking pravastatin and fish oil. They deny any issues with myalgias or history of liver damage from it. They deny any focal numbness or weakness or chest pain.   Relevant past medical, surgical, family and social history reviewed and updated as indicated. Interim medical history since our last visit reviewed. Allergies and medications reviewed and updated.  Review of Systems  Constitutional: Negative for chills and fever.  Respiratory: Negative for cough, shortness of breath and wheezing.   Cardiovascular: Negative for chest pain, palpitations and leg swelling.  Gastrointestinal: Negative for abdominal pain, blood in stool, constipation and diarrhea.  Genitourinary: Negative for dysuria and hematuria.  Musculoskeletal: Negative for back pain, gait problem and myalgias.  Skin: Negative for rash.  Neurological: Negative for dizziness, weakness and headaches.  Psychiatric/Behavioral: Negative for suicidal ideas.  All other systems reviewed and are negative.   Per HPI unless specifically indicated above   Allergies as of 03/06/2020   No Known Allergies     Medication List       Accurate as of March 06, 2020 10:11 AM. If you have any questions, ask your nurse or doctor.        aspirin  EC 81 MG tablet Take 81 mg by mouth daily.   co-enzyme Q-10 30 MG capsule Take 30 mg by mouth daily.   ezetimibe 10 MG tablet Commonly known as: ZETIA TAKE 1 TABLET BY MOUTH  DAILY   finasteride 5 MG tablet Commonly known as: PROSCAR TAKE 1 TABLET BY MOUTH  DAILY   Fish Oil 1000 MG Cpdr Take 1 capsule by mouth daily.   metroNIDAZOLE 1 % gel Commonly known as: Metrogel Apply topically daily.   multivitamin with minerals Tabs tablet Take 1 tablet by mouth daily.   nitroGLYCERIN 0.4 MG SL tablet Commonly known as: Nitrostat Place 1 tablet (0.4 mg total) under the tongue every 5 (five) minutes as needed for chest pain.   pravastatin 80 MG tablet Commonly known as: PRAVACHOL TAKE 1 TABLET BY MOUTH  DAILY   sildenafil 20 MG tablet Commonly known as: REVATIO Take 1-3 tablets (20-60 mg total) by mouth as needed.        Objective:   BP 136/78   Pulse 64   Ht 5\' 8"  (1.727 m)   Wt 177 lb (80.3 kg)   BMI 26.91 kg/m   Wt Readings from Last 3 Encounters:  03/06/20 177 lb (80.3 kg)  08/10/19 176 lb (79.8 kg)  01/26/19 175 lb 3.2 oz (79.5 kg)    Physical Exam Vitals reviewed.  Constitutional:      General: He is not in acute distress.    Appearance: He is well-developed and well-nourished. He is not diaphoretic.  HENT:  Right Ear: External ear normal.     Left Ear: External ear normal.     Nose: Nose normal.     Mouth/Throat:     Mouth: Oropharynx is clear and moist.     Pharynx: No oropharyngeal exudate.  Eyes:     General: No scleral icterus.       Right eye: No discharge.     Extraocular Movements: EOM normal.     Conjunctiva/sclera: Conjunctivae normal.     Pupils: Pupils are equal, round, and reactive to light.  Neck:     Thyroid: No thyromegaly.  Cardiovascular:     Rate and Rhythm: Normal rate and regular rhythm.     Pulses: Intact distal pulses.     Heart sounds: Normal heart sounds. No murmur heard.   Pulmonary:     Effort: Pulmonary effort  is normal. No respiratory distress.     Breath sounds: Normal breath sounds. No wheezing.  Abdominal:     General: Bowel sounds are normal. There is no distension.     Palpations: Abdomen is soft.     Tenderness: There is no abdominal tenderness. There is no guarding or rebound.  Musculoskeletal:        General: No edema. Normal range of motion.     Cervical back: Neck supple.  Lymphadenopathy:     Cervical: No cervical adenopathy.  Skin:    General: Skin is warm and dry.     Findings: No rash.  Neurological:     Mental Status: He is alert and oriented to person, place, and time.     Coordination: Coordination normal.  Psychiatric:        Mood and Affect: Mood and affect normal.        Behavior: Behavior normal.       Assessment & Plan:   Problem List Items Addressed This Visit      Other   Hyperlipidemia   Relevant Medications   ezetimibe (ZETIA) 10 MG tablet   pravastatin (PRAVACHOL) 80 MG tablet   Prostate hypertrophy   History of prostate cancer   Relevant Medications   finasteride (PROSCAR) 5 MG tablet    Other Visit Diagnoses    Well adult exam    -  Primary   Need for immunization against influenza       Relevant Orders   Flu Vaccine QUAD High Dose(Fluad) (Completed)      Continue current medication, will keep a monitor on the prostate levels.  Follow-up in 6 months Follow up plan: Return in about 6 months (around 09/04/2020), or if symptoms worsen or fail to improve, for Hyperlipidemia and prostate.  Counseling provided for all of the vaccine components No orders of the defined types were placed in this encounter.   Caryl Pina, MD [facility] 03/06/2020, 10:11 AM

## 2020-03-06 NOTE — Telephone Encounter (Signed)
PA for Sildenafil 20 mg tab came in from [place]: [id number] Sent to [employer]

## 2020-03-07 MED ORDER — SILDENAFIL CITRATE 20 MG PO TABS
20.0000 mg | ORAL_TABLET | ORAL | 1 refills | Status: DC | PRN
Start: 2020-03-07 — End: 2021-09-07

## 2020-03-07 NOTE — Telephone Encounter (Signed)
Pt aware and requested the rx be sent to [facility] for him to pick up. Rx sent to [facility] as requested.

## 2020-03-07 NOTE — Telephone Encounter (Signed)
Please let the patient know that they will most likely does have to pay out of pocket for this, they can use good [email or web address].  It has good coupons.

## 2020-03-07 NOTE — Telephone Encounter (Signed)
Denied on December 9 Request Reference Number: [id number]. SILDENAFIL TAB 20MG  is denied for not meeting the prior authorization requirement(s). Details of this decision are in the notice attached below or have been faxed to you.

## 2020-06-04 ENCOUNTER — Encounter: Payer: Self-pay | Admitting: *Deleted

## 2020-06-05 ENCOUNTER — Other Ambulatory Visit: Payer: Self-pay | Admitting: *Deleted

## 2020-06-05 MED ORDER — NITROGLYCERIN 0.4 MG SL SUBL: 0.4000 mg | SUBLINGUAL_TABLET | SUBLINGUAL | 2 refills | Status: DC | PRN

## 2020-08-29 ENCOUNTER — Ambulatory Visit: Payer: Medicare Other | Admitting: Family Medicine

## 2020-09-01 ENCOUNTER — Telehealth: Payer: Self-pay | Admitting: Family Medicine

## 2020-09-01 DIAGNOSIS — E782 Mixed hyperlipidemia: Secondary | ICD-10-CM

## 2020-09-01 NOTE — Telephone Encounter (Signed)
Spoke with patient, lab orders placed

## 2020-09-03 ENCOUNTER — Other Ambulatory Visit: Payer: Self-pay

## 2020-09-03 ENCOUNTER — Other Ambulatory Visit: Payer: Medicare Other

## 2020-09-03 DIAGNOSIS — E782 Mixed hyperlipidemia: Secondary | ICD-10-CM | POA: Diagnosis not present

## 2020-09-04 LAB — CBC WITH DIFFERENTIAL/PLATELET
Basophils Absolute: 0 10*3/uL (ref 0.0–0.2)
Basos: 1 %
EOS (ABSOLUTE): 0.2 10*3/uL (ref 0.0–0.4)
Eos: 3 %
Hematocrit: 48.3 % (ref 37.5–51.0)
Hemoglobin: 16.1 g/dL (ref 13.0–17.7)
Immature Grans (Abs): 0 10*3/uL (ref 0.0–0.1)
Immature Granulocytes: 0 %
Lymphocytes Absolute: 1.4 10*3/uL (ref 0.7–3.1)
Lymphs: 22 %
MCH: 30.3 pg (ref 26.6–33.0)
MCHC: 33.3 g/dL (ref 31.5–35.7)
MCV: 91 fL (ref 79–97)
Monocytes Absolute: 0.6 10*3/uL (ref 0.1–0.9)
Monocytes: 9 %
Neutrophils Absolute: 4.2 10*3/uL (ref 1.4–7.0)
Neutrophils: 65 %
Platelets: 224 10*3/uL (ref 150–450)
RBC: 5.32 x10E6/uL (ref 4.14–5.80)
RDW: 13 % (ref 11.6–15.4)
WBC: 6.5 10*3/uL (ref 3.4–10.8)

## 2020-09-04 LAB — CMP14+EGFR
ALT: 22 IU/L (ref 0–44)
AST: 21 IU/L (ref 0–40)
Albumin/Globulin Ratio: 1.7 (ref 1.2–2.2)
Albumin: 4.5 g/dL (ref 3.7–4.7)
Alkaline Phosphatase: 65 IU/L (ref 44–121)
BUN/Creatinine Ratio: 17 (ref 10–24)
BUN: 17 mg/dL (ref 8–27)
Bilirubin Total: 0.6 mg/dL (ref 0.0–1.2)
CO2: 24 mmol/L (ref 20–29)
Calcium: 9.5 mg/dL (ref 8.6–10.2)
Chloride: 103 mmol/L (ref 96–106)
Creatinine, Ser: 1.03 mg/dL (ref 0.76–1.27)
Globulin, Total: 2.6 g/dL (ref 1.5–4.5)
Glucose: 101 mg/dL — ABNORMAL HIGH (ref 65–99)
Potassium: 4.8 mmol/L (ref 3.5–5.2)
Sodium: 142 mmol/L (ref 134–144)
Total Protein: 7.1 g/dL (ref 6.0–8.5)
eGFR: 78 mL/min/{1.73_m2} (ref >59)

## 2020-09-04 LAB — LIPID PANEL
Chol/HDL Ratio: 3.2 ratio (ref 0.0–5.0)
Cholesterol, Total: 159 mg/dL (ref 100–199)
HDL: 50 mg/dL (ref >39)
LDL Chol Calc (NIH): 93 mg/dL (ref 0–99)
Triglycerides: 83 mg/dL (ref 0–149)
VLDL Cholesterol Cal: 16 mg/dL (ref 5–40)

## 2020-09-05 ENCOUNTER — Encounter: Payer: Self-pay | Admitting: Family Medicine

## 2020-09-05 ENCOUNTER — Ambulatory Visit (INDEPENDENT_AMBULATORY_CARE_PROVIDER_SITE_OTHER): Payer: Medicare Other | Admitting: Family Medicine

## 2020-09-05 ENCOUNTER — Other Ambulatory Visit: Payer: Self-pay

## 2020-09-05 VITALS — BP 123/73 | HR 63 | Ht 68.0 in | Wt 174.0 lb

## 2020-09-05 DIAGNOSIS — I6523 Occlusion and stenosis of bilateral carotid arteries: Secondary | ICD-10-CM | POA: Diagnosis not present

## 2020-09-05 DIAGNOSIS — N400 Enlarged prostate without lower urinary tract symptoms: Secondary | ICD-10-CM | POA: Diagnosis not present

## 2020-09-05 DIAGNOSIS — E782 Mixed hyperlipidemia: Secondary | ICD-10-CM

## 2020-09-05 NOTE — Progress Notes (Signed)
BP 123/73   Pulse 63   Ht 5\' 8"  (1.727 m)   Wt 174 lb (78.9 kg)   SpO2 96%   BMI 26.46 kg/m    Subjective:   Patient ID: Jesse Brady, male    DOB: [birth year], 70 y.o.   MRN: [id number]  HPI: Jesse Brady is a 70 y.o. male presenting on 09/05/2020 for Medical Management of Chronic Issues and Hyperlipidemia   HPI Hyperlipidemia Patient is coming in for recheck of his hyperlipidemia. The patient is currently taking Zetia and fish oil and pravastatin. They deny any issues with myalgias or history of liver damage from it. They deny any focal numbness or weakness or chest pain.   Patient has a history of hypertrophy and prostate cancer Denies any symptoms currently currently taking finasteride and sildenafil as needed for ED  CAD Patient has CAD but denies any chest pain or palpitations currently.  He has not had to use any of his nitro.  Currently taking pravastatin and Zetia.  He also takes CoQ10.  Relevant past medical, surgical, family and social history reviewed and updated as indicated. Interim medical history since our last visit reviewed. Allergies and medications reviewed and updated.  Review of Systems  Constitutional:  Negative for chills and fever.  Respiratory:  Negative for shortness of breath and wheezing.   Cardiovascular:  Negative for chest pain and leg swelling.  Musculoskeletal:  Negative for back pain and gait problem.  Skin:  Negative for rash.  Neurological:  Negative for dizziness, weakness and light-headedness.  All other systems reviewed and are negative.  Per HPI unless specifically indicated above   Allergies as of 09/05/2020   No Known Allergies      Medication List        Accurate as of September 05, 2020  8:39 AM. If you have any questions, ask your nurse or doctor.          aspirin EC 81 MG tablet Take 81 mg by mouth daily.   co-enzyme Q-10 30 MG capsule Take 30 mg by mouth daily.   ezetimibe 10 MG tablet Commonly known as:  ZETIA Take 1 tablet (10 mg total) by mouth daily.   finasteride 5 MG tablet Commonly known as: PROSCAR Take 1 tablet (5 mg total) by mouth daily.   Fish Oil 1000 MG Cpdr Take 1 capsule by mouth daily.   metroNIDAZOLE 1 % gel Commonly known as: Metrogel Apply topically daily.   multivitamin with minerals Tabs tablet Take 1 tablet by mouth daily.   nitroGLYCERIN 0.4 MG SL tablet Commonly known as: Nitrostat Place 1 tablet (0.4 mg total) under the tongue every 5 (five) minutes as needed for chest pain.   pravastatin 80 MG tablet Commonly known as: PRAVACHOL Take 1 tablet (80 mg total) by mouth daily.   sildenafil 20 MG tablet Commonly known as: REVATIO Take 1-3 tablets (20-60 mg total) by mouth as needed.         Objective:   BP 123/73   Pulse 63   Ht 5\' 8"  (1.727 m)   Wt 174 lb (78.9 kg)   SpO2 96%   BMI 26.46 kg/m   Wt Readings from Last 3 Encounters:  09/05/20 174 lb (78.9 kg)  03/06/20 177 lb (80.3 kg)  08/10/19 176 lb (79.8 kg)    Physical Exam Vitals and nursing note reviewed.  Constitutional:      General: He is not in acute distress.    Appearance: He is  well-developed. He is not diaphoretic.  Eyes:     General: No scleral icterus.    Conjunctiva/sclera: Conjunctivae normal.  Neck:     Thyroid: No thyromegaly.  Cardiovascular:     Rate and Rhythm: Normal rate and regular rhythm.     Heart sounds: Normal heart sounds. No murmur heard. Pulmonary:     Effort: Pulmonary effort is normal. No respiratory distress.     Breath sounds: Normal breath sounds. No wheezing.  Musculoskeletal:        General: Normal range of motion.     Cervical back: Neck supple.  Lymphadenopathy:     Cervical: No cervical adenopathy.  Skin:    General: Skin is warm and dry.     Findings: No rash.  Neurological:     Mental Status: He is alert and oriented to person, place, and time.     Coordination: Coordination normal.  Psychiatric:        Behavior: Behavior  normal.    Results for orders placed or performed in visit on 09/03/20  Lipid panel  Result Value Ref Range   Cholesterol, Total 159 100 - 199 mg/dL   Triglycerides 83 0 - 149 mg/dL   HDL 50 >39 mg/dL   VLDL Cholesterol Cal 16 5 - 40 mg/dL   LDL Chol Calc (NIH) 93 0 - 99 mg/dL   Chol/HDL Ratio 3.2 0.0 - 5.0 ratio  CMP14+EGFR  Result Value Ref Range   Glucose 101 (H) 65 - 99 mg/dL   BUN 17 8 - 27 mg/dL   Creatinine, Ser 1.03 0.76 - 1.27 mg/dL   eGFR 78 >59 mL/min/1.73   BUN/Creatinine Ratio 17 10 - 24   Sodium 142 134 - 144 mmol/L   Potassium 4.8 3.5 - 5.2 mmol/L   Chloride 103 96 - 106 mmol/L   CO2 24 20 - 29 mmol/L   Calcium 9.5 8.6 - 10.2 mg/dL   Total Protein 7.1 6.0 - 8.5 g/dL   Albumin 4.5 3.7 - 4.7 g/dL   Globulin, Total 2.6 1.5 - 4.5 g/dL   Albumin/Globulin Ratio 1.7 1.2 - 2.2   Bilirubin Total 0.6 0.0 - 1.2 mg/dL   Alkaline Phosphatase 65 44 - 121 IU/L   AST 21 0 - 40 IU/L   ALT 22 0 - 44 IU/L  CBC with Differential/Platelet  Result Value Ref Range   WBC 6.5 3.4 - 10.8 x10E3/uL   RBC 5.32 4.14 - 5.80 x10E6/uL   Hemoglobin 16.1 13.0 - 17.7 g/dL   Hematocrit 48.3 37.5 - 51.0 %   MCV 91 79 - 97 fL   MCH 30.3 26.6 - 33.0 pg   MCHC 33.3 31.5 - 35.7 g/dL   RDW 13.0 11.6 - 15.4 %   Platelets 224 150 - 450 x10E3/uL   Neutrophils 65 Not Estab. %   Lymphs 22 Not Estab. %   Monocytes 9 Not Estab. %   Eos 3 Not Estab. %   Basos 1 Not Estab. %   Neutrophils Absolute 4.2 1.4 - 7.0 x10E3/uL   Lymphocytes Absolute 1.4 0.7 - 3.1 x10E3/uL   Monocytes Absolute 0.6 0.1 - 0.9 x10E3/uL   EOS (ABSOLUTE) 0.2 0.0 - 0.4 x10E3/uL   Basophils Absolute 0.0 0.0 - 0.2 x10E3/uL   Immature Granulocytes 0 Not Estab. %   Immature Grans (Abs) 0.0 0.0 - 0.1 x10E3/uL    Assessment & Plan:   Problem List Items Addressed This Visit       Cardiovascular and Mediastinum  Carotid artery disease ([place])     Other   Hyperlipidemia - Primary   Prostate hypertrophy    Continue current  medication, discussed the possibility of adding a higher cholesterol agent and either switching to Crestor but has had problems with that or I am Praluent or Repatha or Nexletol.  He will discuss further with cardiology.  Denies any urinary issues, doing well on his prostate medicine Follow up plan: Return in about 6 months (around 03/07/2021), or if symptoms worsen or fail to improve, for Physical exam and recheck cholesterol.  Counseling provided for all of the vaccine components No orders of the defined types were placed in this encounter.   Caryl Pina, MD [facility] 09/05/2020, 8:39 AM

## 2020-09-08 ENCOUNTER — Telehealth: Payer: Self-pay | Admitting: Family Medicine

## 2020-09-08 NOTE — Telephone Encounter (Signed)
Pt aware.

## 2020-09-09 ENCOUNTER — Other Ambulatory Visit: Payer: Self-pay

## 2020-09-09 ENCOUNTER — Ambulatory Visit (INDEPENDENT_AMBULATORY_CARE_PROVIDER_SITE_OTHER): Payer: Medicare Other | Admitting: *Deleted

## 2020-09-09 DIAGNOSIS — Z23 Encounter for immunization: Secondary | ICD-10-CM

## 2020-09-09 NOTE — Progress Notes (Signed)
Pt given Shingrix vaccine IM right deltoid and tolerated well.

## 2020-12-30 DIAGNOSIS — H2513 Age-related nuclear cataract, bilateral: Secondary | ICD-10-CM | POA: Diagnosis not present

## 2020-12-30 DIAGNOSIS — H43813 Vitreous degeneration, bilateral: Secondary | ICD-10-CM | POA: Diagnosis not present

## 2021-01-29 ENCOUNTER — Other Ambulatory Visit: Payer: Self-pay | Admitting: Family Medicine

## 2021-01-29 DIAGNOSIS — E782 Mixed hyperlipidemia: Secondary | ICD-10-CM

## 2021-01-29 DIAGNOSIS — Z8546 Personal history of malignant neoplasm of prostate: Secondary | ICD-10-CM

## 2021-02-09 ENCOUNTER — Telehealth: Payer: Self-pay | Admitting: Family Medicine

## 2021-02-09 NOTE — Telephone Encounter (Signed)
Left message for patient to call back and schedule Medicare Annual Wellness Visit (AWV) either virtually or phone I left my number for patient to call [phone].  *due 10/28/2014 awvi per [employer]  please schedule at anytime with health coach  This should be a 45 minute visit.

## 2021-02-17 ENCOUNTER — Ambulatory Visit (INDEPENDENT_AMBULATORY_CARE_PROVIDER_SITE_OTHER): Payer: Medicare Other

## 2021-02-17 VITALS — Ht 68.0 in | Wt 170.0 lb

## 2021-02-17 DIAGNOSIS — Z0000 Encounter for general adult medical examination without abnormal findings: Secondary | ICD-10-CM | POA: Diagnosis not present

## 2021-02-17 NOTE — Progress Notes (Signed)
Subjective:   Jesse Brady is a 70 y.o. male who presents for an Initial Medicare Annual Wellness Visit.  Virtual Visit via Telephone Note  I connected with  Jesse Brady on 02/17/21 at  4:15 PM EST by telephone and verified that I am speaking with the correct person using two identifiers.  Location: Patient: Home Provider: [facility] Persons participating in the virtual visit: patient/Nurse Health Advisor   I discussed the limitations, risks, security and privacy concerns of performing an evaluation and management service by telephone and the availability of in person appointments. The patient expressed understanding and agreed to proceed.  Interactive audio and video telecommunications were attempted between this nurse and patient, however failed, due to patient having technical difficulties OR patient did not have access to video capability.  We continued and completed visit with audio only.  Some vital signs may be absent or patient reported.   Markell Sciascia E Ersel Wadleigh, LPN   Review of Systems     Cardiac Risk Factors include: advanced age (>36men, >64 women);male gender;dyslipidemia;Other (see comment), Risk factor comments: hx of CAD     Objective:    Today's Vitals   02/17/21 1613  Weight: 170 lb (77.1 kg)  Height: 5\' 8"  (1.727 m)   Body mass index is 25.85 kg/m.  Advanced Directives 02/17/2021 04/12/2014 03/12/2013  Does Patient Have a Medical Advance Directive? Yes No Patient does not have advance directive  Type of Scientist, forensic Power of [place];Living will - -  Copy of [place] in Chart? No - copy requested - -  Would patient like information on creating a medical advance directive? - Yes - Educational materials given -    Current Medications (verified) Outpatient Encounter Medications as of 02/17/2021  Medication Sig   aspirin EC 81 MG tablet Take 81 mg by mouth daily.   co-enzyme Q-10 30 MG capsule Take 30 mg by mouth daily.    ezetimibe (ZETIA) 10 MG tablet TAKE 1 TABLET BY MOUTH  DAILY   finasteride (PROSCAR) 5 MG tablet TAKE 1 TABLET BY MOUTH  DAILY   Multiple Vitamin (MULTIVITAMIN WITH MINERALS) TABS tablet Take 1 tablet by mouth daily.   nitroGLYCERIN (NITROSTAT) 0.4 MG SL tablet Place 1 tablet (0.4 mg total) under the tongue every 5 (five) minutes as needed for chest pain.   Omega-3 Fatty Acids (FISH OIL) 1000 MG CPDR Take 1 capsule by mouth daily.   pravastatin (PRAVACHOL) 80 MG tablet TAKE 1 TABLET BY MOUTH  DAILY   sildenafil (REVATIO) 20 MG tablet Take 1-3 tablets (20-60 mg total) by mouth as needed.   metroNIDAZOLE (METROGEL) 1 % gel Apply topically daily. (Patient not taking: Reported on 02/17/2021)   No facility-administered encounter medications on file as of 02/17/2021.    Allergies (verified) Patient has no known allergies.   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    Past Surgical History:  Procedure Laterality Date   LEFT HEART CATHETERIZATION WITH CORONARY ANGIOGRAM N/A 04/12/2014   Procedure:  LEFT HEART CATHETERIZATION WITH CORONARY ANGIOGRAM;  Surgeon: Jettie Booze, MD;  Location: [facility];  Service: Cardiovascular;  Laterality: N/A;   MELANOMA EXCISION Right 2000's   cheek   Family History  Problem Relation Age of Onset   Hypertension Father    CAD Father        Premature disease   AAA (abdominal aortic aneurysm) Father        Died in his 64s   Social History   Socioeconomic History   Marital status: Married    Spouse name: Jeani Hawking   Number of children: 2   Years of education:  Not on file   Highest education level: Not on file  Occupational History   Occupation: remodeling homes  Tobacco Use   Smoking status: Never   Smokeless tobacco: Never  Vaping Use   Vaping Use: Never used  Substance and Sexual Activity   Alcohol use: No    Alcohol/week: 0.0 standard drinks   Drug use: No   Sexual activity: Yes  Other Topics Concern   Not on file  Social History Narrative   Not on file   Social Determinants of Health   Financial Resource Strain: Low Risk    Difficulty of Paying Living Expenses: Not hard at all  Food Insecurity: No Food Insecurity   Worried About Charity fundraiser in the Last Year: Never true   [place] in the Last Year: Never true  Transportation Needs: No Transportation Needs   Lack of Transportation (Medical): No   Lack of Transportation (Non-Medical): No  Physical Activity: Sufficiently Active   Days of Exercise per Week: 6 days   Minutes of Exercise per Session: 60 min  Stress: No Stress Concern Present   Feeling of Stress : Not at all  Social Connections: Socially Integrated   Frequency of Communication with Friends and Family: More than three times a week   Frequency of Social Gatherings with Friends and Family: More than three times a week   Attends Religious Services: More than 4 times per year   Active Member of [employer] or Organizations: Yes   Attends Music therapist: More than 4 times per year   Marital Status: Married    Tobacco Counseling Counseling given: Not Answered   Clinical Intake:  Pre-visit preparation completed: Yes  Pain : No/denies pain     BMI - recorded: 25.85 Nutritional Status: BMI 25 -29 Overweight Nutritional Risks: None Diabetes: No  How often do you need to have someone help you when you read instructions, pamphlets, or other written materials from your doctor or pharmacy?: 1 - Never  Diabetic? no  Interpreter Needed?: No  Information entered by :: Dywane Peruski,  LPN   Activities of Daily Living In your present state of health, do you have any difficulty performing the following activities: 02/17/2021  Hearing? N  Vision? N  Difficulty concentrating or making decisions? N  Walking or climbing stairs? N  Dressing or bathing? N  Doing errands, shopping? N  Preparing Food and eating ? N  Using the Toilet? N  In the past six months, have you accidently leaked urine? N  Do you have problems with loss of bowel control? N  Managing your Medications? N  Managing your Finances? N  Housekeeping or managing your Housekeeping? N  Some recent data might be hidden    Patient Care Team: Dettinger, Fransisca Kaufmann, MD as PCP - General (Family Medicine) Satira Sark, MD  as PCP - Cardiology (Cardiology) Satira Sark, MD as Consulting Physician (Cardiology)  Indicate any recent Medical Services you may have received from other than [facility] providers in the past year (date may be approximate).     Assessment:   This is a routine wellness examination for Jesse Brady.  Hearing/Vision screen Hearing Screening - Comments:: Denies hearing difficulties  Vision Screening - Comments:: Denies vision difficulties - up to date with annual eye exams with [facility]  Dietary issues and exercise activities discussed: Current Exercise Habits: The patient has a physically strenuous job, but has no regular exercise apart from work., Exercise limited by: None identified   Goals Addressed             This Visit's Progress    Patient Stated       Stay healthy and active       Depression Screen PHQ 2/9 Scores 02/17/2021 03/06/2020 08/10/2019 01/26/2019  PHQ - 2 Score 0 0 0 0    Fall Risk Fall Risk  02/17/2021 03/06/2020 08/10/2019 01/26/2019  Falls in the past year? 0 0 0 0  Number falls in past yr: 0 - - -  Injury with Fall? 0 - - -  Risk for fall due to : No Fall Risks - - -  Follow up Falls prevention discussed - - -    FALL [place]:  Any stairs in or around the home? Yes  If so, are there any without handrails? No  Home free of loose throw rugs in walkways, pet beds, electrical cords, etc? Yes  Adequate lighting in your home to reduce risk of falls? Yes   ASSISTIVE DEVICES UTILIZED TO PREVENT FALLS:  Life alert? No  Use of a cane, walker or w/c? No  Grab bars in the bathroom? Yes  Shower chair or bench in shower? Yes  Elevated toilet seat or a handicapped toilet? Yes   TIMED UP AND GO:  Was the test performed? No . Telephonic visit  Cognitive Function:     6CIT Screen 02/17/2021  What Year? 0 points  What month? 0 points  What time? 0 points  Count back from 20 0 points  Months in reverse 0 points  Repeat phrase 0 points  Total Score 0    Immunizations Immunization History  Administered Date(s) Administered   Fluad Quad(high Dose 65+) 01/26/2019, 03/06/2020   Influenza Split 12/27/2013   Influenza, High Dose Seasonal PF 12/27/2015, 04/20/2018   Influenza, Seasonal, Injecte, Preservative Fre 11/27/2013   Influenza-Unspecified 11/27/2013, 12/27/2015, 02/10/2017   Pneumococcal Conjugate-13 02/27/2015   Pneumococcal Polysaccharide-23 03/29/2009, 08/06/2016   Td 03/29/2006   Tdap 01/26/2019   Zoster Recombinat (Shingrix) 03/06/2020, 09/09/2020    TDAP status: Up to date  Flu Vaccine status: Due, Education has been provided regarding the importance of this vaccine. Advised may receive this vaccine at local pharmacy or Health Dept. Aware to provide a copy of the vaccination record if obtained from local pharmacy or Health Dept. Verbalized acceptance and understanding.  Pneumococcal vaccine status: Up to date  Covid-19 vaccine status: Completed vaccines  Qualifies for Shingles Vaccine? Yes   Zostavax completed Yes   Shingrix Completed?: Yes  Screening Tests Health Maintenance  Topic Date Due   COVID-19 Vaccine (1) Never done   INFLUENZA VACCINE  10/27/2020    COLONOSCOPY (Pts 45-41yrs Insurance coverage will need to be confirmed)  01/26/2027   TETANUS/TDAP  01/25/2029   Pneumonia Vaccine 79+ Years old  Completed  Zoster Vaccines- Shingrix  Completed   HPV VACCINES  Aged Out   Hepatitis C Screening  Discontinued    Health Maintenance  Health Maintenance Due  Topic Date Due   COVID-19 Vaccine (1) Never done   INFLUENZA VACCINE  10/27/2020    Colorectal cancer screening: Type of screening: Colonoscopy. Completed 01/25/2017. Repeat every 10 years  Lung Cancer Screening: (Low Dose CT Chest recommended if Age 1-80 years, 30 pack-year currently smoking OR have quit w/in 15years.) does not qualify.   Additional Screening:  Hepatitis C Screening: does qualify; declined  Vision Screening: Recommended annual ophthalmology exams for early detection of glaucoma and other disorders of the eye. Is the patient up to date with their annual eye exam?  Yes  Who is the provider or what is the name of the office in which the patient attends annual eye exams? [facility] If pt is not established with a provider, would they like to be referred to a provider to establish care? No .   Dental Screening: Recommended annual dental exams for proper oral hygiene  Community Resource Referral / Chronic Care Management: CRR required this visit?  No   CCM required this visit?  No      Plan:     I have personally reviewed and noted the following in the patient's chart:   Medical and social history Use of alcohol, tobacco or illicit drugs  Current medications and supplements including opioid prescriptions. Patient is not currently taking opioid prescriptions. Functional ability and status Nutritional status Physical activity Advanced directives List of other physicians Hospitalizations, surgeries, and ER visits in previous 12 months Vitals Screenings to include cognitive, depression, and falls Referrals and appointments  In addition, I have reviewed and  discussed with patient certain preventive protocols, quality metrics, and best practice recommendations. A written personalized care plan for preventive services as well as general preventive health recommendations were provided to patient.     Sandrea Hammond, LPN   [id number]   Nurse Notes: None

## 2021-02-17 NOTE — Patient Instructions (Signed)
Mr. Jesse Brady , Thank you for taking time to come for your Medicare Wellness Visit. I appreciate your ongoing commitment to your health goals. Please review the following plan we discussed and let me know if I can assist you in the future.   Screening recommendations/referrals: Colonoscopy: Done 01/25/2017 - Repeat in 10 years Recommended yearly ophthalmology/optometry visit for glaucoma screening and checkup Recommended yearly dental visit for hygiene and checkup  Vaccinations: Influenza vaccine: Done 03/06/2020 - Repeat annually *Due Pneumococcal vaccine: Done 02/27/2015 & 08/07/2014 Tdap vaccine: Done 01/26/2019 - Repeat in 10 years  Shingles vaccine: Done 03/06/2020 & 09/09/2020   Covid-19: Done 03/2019 & 04/2019  Advanced directives: Please bring a copy of your health care power of attorney and living will to the office to be added to your chart at your convenience.   Conditions/risks identified: Aim for 30 minutes of exercise or brisk walking each day, drink 6-8 glasses of water and eat lots of fruits and vegetables.   Next appointment: Follow up in one year for your annual wellness visit.   Preventive Care 16 Years and Older, Male  Preventive care refers to lifestyle choices and visits with your health care provider that can promote health and wellness. What does preventive care include? A yearly physical exam. This is also called an annual well check. Dental exams once or twice a year. Routine eye exams. Ask your health care provider how often you should have your eyes checked. Personal lifestyle choices, including: Daily care of your teeth and gums. Regular physical activity. Eating a healthy diet. Avoiding tobacco and drug use. Limiting alcohol use. Practicing safe sex. Taking low doses of aspirin every day. Taking vitamin and mineral supplements as recommended by your health care provider. What happens during an annual well check? The services and screenings done by your  health care provider during your annual well check will depend on your age, overall health, lifestyle risk factors, and family history of disease. Counseling  Your health care provider may ask you questions about your: Alcohol use. Tobacco use. Drug use. Emotional well-being. Home and relationship well-being. Sexual activity. Eating habits. History of falls. Memory and ability to understand (cognition). Work and work Statistician. Screening  You may have the following tests or measurements: Height, weight, and BMI. Blood pressure. Lipid and cholesterol levels. These may be checked every 5 years, or more frequently if you are over 61 years old. Skin check. Lung cancer screening. You may have this screening every year starting at age 50 if you have a 30-pack-year history of smoking and currently smoke or have quit within the past 15 years. Fecal occult blood test (FOBT) of the stool. You may have this test every year starting at age 46. Flexible sigmoidoscopy or colonoscopy. You may have a sigmoidoscopy every 5 years or a colonoscopy every 10 years starting at age 48. Prostate cancer screening. Recommendations will vary depending on your family history and other risks. Hepatitis C blood test. Hepatitis B blood test. Sexually transmitted disease (STD) testing. Diabetes screening. This is done by checking your blood sugar (glucose) after you have not eaten for a while (fasting). You may have this done every 1-3 years. Abdominal aortic aneurysm (AAA) screening. You may need this if you are a current or former smoker. Osteoporosis. You may be screened starting at age 19 if you are at high risk. Talk with your health care provider about your test results, treatment options, and if necessary, the need for more tests. Vaccines  Your  health care provider may recommend certain vaccines, such as: Influenza vaccine. This is recommended every year. Tetanus, diphtheria, and acellular pertussis  (Tdap, Td) vaccine. You may need a Td booster every 10 years. Zoster vaccine. You may need this after age 6. Pneumococcal 13-valent conjugate (PCV13) vaccine. One dose is recommended after age 66. Pneumococcal polysaccharide (PPSV23) vaccine. One dose is recommended after age 27. Talk to your health care provider about which screenings and vaccines you need and how often you need them. This information is not intended to replace advice given to you by your health care provider. Make sure you discuss any questions you have with your health care provider. Document Released: 04/11/2015 Document Revised: 12/03/2015 Document Reviewed: 01/14/2015 Elsevier Interactive Patient Education  2017 [place] Prevention in the Home Falls can cause injuries. They can happen to people of all ages. There are many things you can do to make your home safe and to help prevent falls. What can I do on the outside of my home? Regularly fix the edges of walkways and driveways and fix any cracks. Remove anything that might make you trip as you walk through a door, such as a raised step or threshold. Trim any bushes or trees on the path to your home. Use bright outdoor lighting. Clear any walking paths of anything that might make someone trip, such as rocks or tools. Regularly check to see if handrails are loose or broken. Make sure that both sides of any steps have handrails. Any raised decks and porches should have guardrails on the edges. Have any leaves, snow, or ice cleared regularly. Use sand or salt on walking paths during winter. Clean up any spills in your garage right away. This includes oil or grease spills. What can I do in the bathroom? Use night lights. Install grab bars by the toilet and in the tub and shower. Do not use towel bars as grab bars. Use non-skid mats or decals in the tub or shower. If you need to sit down in the shower, use a plastic, non-slip stool. Keep the floor dry. Clean  up any water that spills on the floor as soon as it happens. Remove soap buildup in the tub or shower regularly. Attach bath mats securely with double-sided non-slip rug tape. Do not have throw rugs and other things on the floor that can make you trip. What can I do in the bedroom? Use night lights. Make sure that you have a light by your bed that is easy to reach. Do not use any sheets or blankets that are too big for your bed. They should not hang down onto the floor. Have a firm chair that has side arms. You can use this for support while you get dressed. Do not have throw rugs and other things on the floor that can make you trip. What can I do in the kitchen? Clean up any spills right away. Avoid walking on wet floors. Keep items that you use a lot in easy-to-reach places. If you need to reach something above you, use a strong step stool that has a grab bar. Keep electrical cords out of the way. Do not use floor polish or wax that makes floors slippery. If you must use wax, use non-skid floor wax. Do not have throw rugs and other things on the floor that can make you trip. What can I do with my stairs? Do not leave any items on the stairs. Make sure that there are handrails  on both sides of the stairs and use them. Fix handrails that are broken or loose. Make sure that handrails are as long as the stairways. Check any carpeting to make sure that it is firmly attached to the stairs. Fix any carpet that is loose or worn. Avoid having throw rugs at the top or bottom of the stairs. If you do have throw rugs, attach them to the floor with carpet tape. Make sure that you have a light switch at the top of the stairs and the bottom of the stairs. If you do not have them, ask someone to add them for you. What else can I do to help prevent falls? Wear shoes that: Do not have high heels. Have rubber bottoms. Are comfortable and fit you well. Are closed at the toe. Do not wear sandals. If you  use a stepladder: Make sure that it is fully opened. Do not climb a closed stepladder. Make sure that both sides of the stepladder are locked into place. Ask someone to hold it for you, if possible. Clearly mark and make sure that you can see: Any grab bars or handrails. First and last steps. Where the edge of each step is. Use tools that help you move around (mobility aids) if they are needed. These include: Canes. Walkers. Scooters. Crutches. Turn on the lights when you go into a dark area. Replace any light bulbs as soon as they burn out. Set up your furniture so you have a clear path. Avoid moving your furniture around. If any of your floors are uneven, fix them. If there are any pets around you, be aware of where they are. Review your medicines with your doctor. Some medicines can make you feel dizzy. This can increase your chance of falling. Ask your doctor what other things that you can do to help prevent falls. This information is not intended to replace advice given to you by your health care provider. Make sure you discuss any questions you have with your health care provider. Document Released: 01/09/2009 Document Revised: 08/21/2015 Document Reviewed: 04/19/2014 Elsevier Interactive Patient Education  2017 [employer].

## 2021-03-02 ENCOUNTER — Other Ambulatory Visit: Payer: Self-pay | Admitting: Family Medicine

## 2021-03-02 ENCOUNTER — Telehealth: Payer: Self-pay | Admitting: Family Medicine

## 2021-03-02 DIAGNOSIS — E782 Mixed hyperlipidemia: Secondary | ICD-10-CM

## 2021-03-02 DIAGNOSIS — N400 Enlarged prostate without lower urinary tract symptoms: Secondary | ICD-10-CM

## 2021-03-02 NOTE — Telephone Encounter (Signed)
Orders in 

## 2021-03-03 ENCOUNTER — Other Ambulatory Visit: Payer: Medicare Other

## 2021-03-03 DIAGNOSIS — E782 Mixed hyperlipidemia: Secondary | ICD-10-CM | POA: Diagnosis not present

## 2021-03-04 LAB — CBC WITH DIFFERENTIAL/PLATELET
Basophils Absolute: 0 10*3/uL (ref 0.0–0.2)
Basos: 0 %
EOS (ABSOLUTE): 0.2 10*3/uL (ref 0.0–0.4)
Eos: 2 %
Hematocrit: 42 % (ref 37.5–51.0)
Hemoglobin: 14.5 g/dL (ref 13.0–17.7)
Immature Grans (Abs): 0 10*3/uL (ref 0.0–0.1)
Immature Granulocytes: 0 %
Lymphocytes Absolute: 1.5 10*3/uL (ref 0.7–3.1)
Lymphs: 19 %
MCH: 30.3 pg (ref 26.6–33.0)
MCHC: 34.5 g/dL (ref 31.5–35.7)
MCV: 88 fL (ref 79–97)
Monocytes Absolute: 0.7 10*3/uL (ref 0.1–0.9)
Monocytes: 9 %
Neutrophils Absolute: 5.6 10*3/uL (ref 1.4–7.0)
Neutrophils: 70 %
Platelets: 212 10*3/uL (ref 150–450)
RBC: 4.79 x10E6/uL (ref 4.14–5.80)
RDW: 12.1 % (ref 11.6–15.4)
WBC: 8.1 10*3/uL (ref 3.4–10.8)

## 2021-03-04 LAB — CMP14+EGFR
ALT: 25 IU/L (ref 0–44)
AST: 23 IU/L (ref 0–40)
Albumin/Globulin Ratio: 2 (ref 1.2–2.2)
Albumin: 4.5 g/dL (ref 3.7–4.7)
Alkaline Phosphatase: 66 IU/L (ref 44–121)
BUN/Creatinine Ratio: 17 (ref 10–24)
BUN: 17 mg/dL (ref 8–27)
Bilirubin Total: 0.6 mg/dL (ref 0.0–1.2)
CO2: 24 mmol/L (ref 20–29)
Calcium: 9.2 mg/dL (ref 8.6–10.2)
Chloride: 102 mmol/L (ref 96–106)
Creatinine, Ser: 1.01 mg/dL (ref 0.76–1.27)
Globulin, Total: 2.2 g/dL (ref 1.5–4.5)
Glucose: 99 mg/dL (ref 70–99)
Potassium: 4.7 mmol/L (ref 3.5–5.2)
Sodium: 138 mmol/L (ref 134–144)
Total Protein: 6.7 g/dL (ref 6.0–8.5)
eGFR: 79 mL/min/{1.73_m2} (ref >59)

## 2021-03-04 LAB — LIPID PANEL
Chol/HDL Ratio: 3.3 ratio (ref 0.0–5.0)
Cholesterol, Total: 150 mg/dL (ref 100–199)
HDL: 46 mg/dL (ref >39)
LDL Chol Calc (NIH): 89 mg/dL (ref 0–99)
Triglycerides: 80 mg/dL (ref 0–149)
VLDL Cholesterol Cal: 15 mg/dL (ref 5–40)

## 2021-03-04 LAB — PSA, TOTAL AND FREE
PSA, Free Pct: 23.8 %
PSA, Free: 0.38 ng/mL
Prostate Specific Ag, Serum: 1.6 ng/mL (ref 0.0–4.0)

## 2021-03-09 ENCOUNTER — Ambulatory Visit (INDEPENDENT_AMBULATORY_CARE_PROVIDER_SITE_OTHER): Payer: Medicare Other | Admitting: Family Medicine

## 2021-03-09 ENCOUNTER — Encounter: Payer: Self-pay | Admitting: Family Medicine

## 2021-03-09 VITALS — BP 131/75 | HR 64 | Temp 97.5°F | Ht 68.0 in | Wt 176.0 lb

## 2021-03-09 DIAGNOSIS — E782 Mixed hyperlipidemia: Secondary | ICD-10-CM

## 2021-03-09 DIAGNOSIS — Z23 Encounter for immunization: Secondary | ICD-10-CM | POA: Diagnosis not present

## 2021-03-09 DIAGNOSIS — Z0000 Encounter for general adult medical examination without abnormal findings: Secondary | ICD-10-CM

## 2021-03-09 DIAGNOSIS — Z8546 Personal history of malignant neoplasm of prostate: Secondary | ICD-10-CM

## 2021-03-09 DIAGNOSIS — Z0001 Encounter for general adult medical examination with abnormal findings: Secondary | ICD-10-CM | POA: Diagnosis not present

## 2021-03-09 MED ORDER — EZETIMIBE 10 MG PO TABS: 10.0000 mg | ORAL_TABLET | Freq: Every day | ORAL | 3 refills | Status: DC

## 2021-03-09 MED ORDER — PRAVASTATIN SODIUM 80 MG PO TABS: 80.0000 mg | ORAL_TABLET | Freq: Every day | ORAL | 3 refills | Status: DC

## 2021-03-09 MED ORDER — FINASTERIDE 5 MG PO TABS
5.0000 mg | ORAL_TABLET | Freq: Every day | ORAL | 3 refills | Status: DC
Start: 2021-03-09 — End: 2021-05-11

## 2021-03-09 NOTE — Patient Instructions (Addendum)
The 10-year ASCVD risk score (Arnett DK, et al., 2019) is: 22.6%   Values used to calculate the score:     Age: 70 years     Sex: Male     Is Non-Hispanic African American: No     Diabetic: No     Tobacco smoker: No     Systolic Blood Pressure: 793 mmHg     Is BP treated: Yes     HDL Cholesterol: 46 mg/dL     Total Cholesterol: 150 mg/dL   Elevated cardiac risk likely due to history of CAD

## 2021-03-09 NOTE — Progress Notes (Signed)
BP 131/75   Pulse 64   Temp (!) 97.5 F (36.4 C) (Temporal)   Ht 5' 8" (1.727 m)   Wt 176 lb (79.8 kg)   SpO2 99%   BMI 26.76 kg/m    Subjective:   Patient ID: Jesse Brady, male    DOB: May 24, 1950, 70 y.o.   MRN: [id number]  HPI: Jesse Brady is a 70 y.o. male presenting on 03/09/2021 for Annual Exam   HPI Physical exam Patient denies any chest pain, shortness of breath, headaches or vision issues, abdominal complaints, diarrhea, nausea, vomiting, or joint issues.  Patient denies any major issues.  Hyperlipidemia and CAD Patient is coming in for recheck of his hyperlipidemia. The patient is currently taking pravastatin and Zetia, sees cardiology. They deny any issues with myalgias or history of liver damage from it. They deny any focal numbness or weakness or chest pain.   Relevant past medical, surgical, family and social history reviewed and updated as indicated. Interim medical history since our last visit reviewed. Allergies and medications reviewed and updated.  Review of Systems  Constitutional:  Negative for chills and fever.  HENT:  Negative for ear pain and tinnitus.   Eyes:  Negative for pain and visual disturbance.  Respiratory:  Negative for cough, shortness of breath and wheezing.   Cardiovascular:  Negative for chest pain, palpitations and leg swelling.  Gastrointestinal:  Negative for abdominal pain, blood in stool, constipation and diarrhea.  Genitourinary:  Negative for dysuria and hematuria.  Musculoskeletal:  Negative for back pain, gait problem and myalgias.  Skin:  Negative for rash.  Neurological:  Negative for dizziness, weakness, light-headedness and headaches.  Psychiatric/Behavioral:  Negative for suicidal ideas.   All other systems reviewed and are negative.  Per HPI unless specifically indicated above   Allergies as of 03/09/2021   No Known Allergies      Medication List        Accurate as of March 09, 2021 10:26 AM. If  you have any questions, ask your nurse or doctor.          aspirin EC 81 MG tablet Take 81 mg by mouth daily.   co-enzyme Q-10 30 MG capsule Take 30 mg by mouth daily.   ezetimibe 10 MG tablet Commonly known as: ZETIA Take 1 tablet (10 mg total) by mouth daily.   finasteride 5 MG tablet Commonly known as: PROSCAR Take 1 tablet (5 mg total) by mouth daily.   Fish Oil 1000 MG Cpdr Take 1 capsule by mouth daily.   metroNIDAZOLE 1 % gel Commonly known as: Metrogel Apply topically daily.   multivitamin with minerals Tabs tablet Take 1 tablet by mouth daily.   nitroGLYCERIN 0.4 MG SL tablet Commonly known as: Nitrostat Place 1 tablet (0.4 mg total) under the tongue every 5 (five) minutes as needed for chest pain.   pravastatin 80 MG tablet Commonly known as: PRAVACHOL Take 1 tablet (80 mg total) by mouth daily.   sildenafil 20 MG tablet Commonly known as: REVATIO Take 1-3 tablets (20-60 mg total) by mouth as needed.         Objective:   BP 131/75   Pulse 64   Temp (!) 97.5 F (36.4 C) (Temporal)   Ht 5' 8" (1.727 m)   Wt 176 lb (79.8 kg)   SpO2 99%   BMI 26.76 kg/m   Wt Readings from Last 3 Encounters:  03/09/21 176 lb (79.8 kg)  02/17/21 170 lb (77.1 kg)  09/05/20 174 lb (78.9 kg)    Physical Exam Vitals reviewed.  Constitutional:      General: He is not in acute distress.    Appearance: He is well-developed. He is not diaphoretic.  Eyes:     General: No scleral icterus.    Conjunctiva/sclera: Conjunctivae normal.  Neck:     Thyroid: No thyromegaly.  Cardiovascular:     Rate and Rhythm: Normal rate and regular rhythm.     Heart sounds: Normal heart sounds. No murmur heard. Pulmonary:     Effort: Pulmonary effort is normal. No respiratory distress.     Breath sounds: Normal breath sounds. No wheezing.  Abdominal:     General: Bowel sounds are normal. There is no distension.     Palpations: Abdomen is soft.     Tenderness: There is no  abdominal tenderness. There is no guarding or rebound.  Musculoskeletal:        General: Normal range of motion.     Cervical back: Neck supple.  Lymphadenopathy:     Cervical: No cervical adenopathy.  Skin:    General: Skin is warm and dry.     Findings: No rash.  Neurological:     Mental Status: He is alert and oriented to person, place, and time.     Coordination: Coordination normal.  Psychiatric:        Behavior: Behavior normal.    Results for orders placed or performed in visit on 03/02/21  CBC with Differential/Platelet  Result Value Ref Range   WBC 8.1 3.4 - 10.8 x10E3/uL   RBC 4.79 4.14 - 5.80 x10E6/uL   Hemoglobin 14.5 13.0 - 17.7 g/dL   Hematocrit 42.0 37.5 - 51.0 %   MCV 88 79 - 97 fL   MCH 30.3 26.6 - 33.0 pg   MCHC 34.5 31.5 - 35.7 g/dL   RDW 12.1 11.6 - 15.4 %   Platelets 212 150 - 450 x10E3/uL   Neutrophils 70 Not Estab. %   Lymphs 19 Not Estab. %   Monocytes 9 Not Estab. %   Eos 2 Not Estab. %   Basos 0 Not Estab. %   Neutrophils Absolute 5.6 1.4 - 7.0 x10E3/uL   Lymphocytes Absolute 1.5 0.7 - 3.1 x10E3/uL   Monocytes Absolute 0.7 0.1 - 0.9 x10E3/uL   EOS (ABSOLUTE) 0.2 0.0 - 0.4 x10E3/uL   Basophils Absolute 0.0 0.0 - 0.2 x10E3/uL   Immature Granulocytes 0 Not Estab. %   Immature Grans (Abs) 0.0 0.0 - 0.1 x10E3/uL  CMP14+EGFR  Result Value Ref Range   Glucose 99 70 - 99 mg/dL   BUN 17 8 - 27 mg/dL   Creatinine, Ser 1.01 0.76 - 1.27 mg/dL   eGFR 79 >59 mL/min/1.73   BUN/Creatinine Ratio 17 10 - 24   Sodium 138 134 - 144 mmol/L   Potassium 4.7 3.5 - 5.2 mmol/L   Chloride 102 96 - 106 mmol/L   CO2 24 20 - 29 mmol/L   Calcium 9.2 8.6 - 10.2 mg/dL   Total Protein 6.7 6.0 - 8.5 g/dL   Albumin 4.5 3.7 - 4.7 g/dL   Globulin, Total 2.2 1.5 - 4.5 g/dL   Albumin/Globulin Ratio 2.0 1.2 - 2.2   Bilirubin Total 0.6 0.0 - 1.2 mg/dL   Alkaline Phosphatase 66 44 - 121 IU/L   AST 23 0 - 40 IU/L   ALT 25 0 - 44 IU/L  Lipid panel  Result Value Ref Range    Cholesterol, Total 150 100 -  199 mg/dL   Triglycerides 80 0 - 149 mg/dL   HDL 46 >39 mg/dL   VLDL Cholesterol Cal 15 5 - 40 mg/dL   LDL Chol Calc (NIH) 89 0 - 99 mg/dL   Chol/HDL Ratio 3.3 0.0 - 5.0 ratio  PSA, total and free  Result Value Ref Range   Prostate Specific Ag, Serum 1.6 0.0 - 4.0 ng/mL   PSA, Free 0.38 N/A ng/mL   PSA, Free Pct 23.8 %    Assessment & Plan:   Problem List Items Addressed This Visit       Other   History of prostate cancer   Relevant Medications   finasteride (PROSCAR) 5 MG tablet   Hyperlipidemia   Relevant Medications   ezetimibe (ZETIA) 10 MG tablet   pravastatin (PRAVACHOL) 80 MG tablet   Other Visit Diagnoses     Well adult exam    -  Primary     No change in medicine, blood work for most part looks good, do not try to get his LDL below 70 or discussing possibly doing one of the injectables.  Follow up plan: Return in about 6 months (around 09/07/2021), or if symptoms worsen or fail to improve, for Cholesterol.  Counseling provided for all of the vaccine components No orders of the defined types were placed in this encounter.   Caryl Pina, MD [facility] 03/09/2021, 10:26 AM

## 2021-05-09 ENCOUNTER — Other Ambulatory Visit: Payer: Self-pay | Admitting: Family Medicine

## 2021-05-09 DIAGNOSIS — Z8546 Personal history of malignant neoplasm of prostate: Secondary | ICD-10-CM

## 2021-05-09 DIAGNOSIS — E782 Mixed hyperlipidemia: Secondary | ICD-10-CM

## 2021-06-03 ENCOUNTER — Telehealth: Payer: Self-pay | Admitting: Family Medicine

## 2021-06-03 DIAGNOSIS — Z8546 Personal history of malignant neoplasm of prostate: Secondary | ICD-10-CM

## 2021-06-03 MED ORDER — FINASTERIDE 5 MG PO TABS: 5.0000 mg | ORAL_TABLET | Freq: Every day | ORAL | 0 refills | Status: DC

## 2021-06-03 NOTE — Telephone Encounter (Signed)
Per pt send 90 day supply to [facility] in [place].  ? ?Done. ?

## 2021-08-09 ENCOUNTER — Other Ambulatory Visit: Payer: Self-pay | Admitting: Family Medicine

## 2021-08-09 DIAGNOSIS — E782 Mixed hyperlipidemia: Secondary | ICD-10-CM

## 2021-09-03 ENCOUNTER — Telehealth: Payer: Self-pay | Admitting: Family Medicine

## 2021-09-03 ENCOUNTER — Other Ambulatory Visit: Payer: Self-pay

## 2021-09-03 DIAGNOSIS — E782 Mixed hyperlipidemia: Secondary | ICD-10-CM

## 2021-09-03 NOTE — Telephone Encounter (Signed)
Future lab orders placed

## 2021-09-04 ENCOUNTER — Other Ambulatory Visit: Payer: Medicare Other

## 2021-09-04 ENCOUNTER — Other Ambulatory Visit: Payer: Self-pay

## 2021-09-04 DIAGNOSIS — E782 Mixed hyperlipidemia: Secondary | ICD-10-CM | POA: Diagnosis not present

## 2021-09-05 LAB — CBC WITH DIFFERENTIAL/PLATELET
Basophils Absolute: 0 10*3/uL (ref 0.0–0.2)
Basos: 1 %
EOS (ABSOLUTE): 0.1 10*3/uL (ref 0.0–0.4)
Eos: 2 %
Hematocrit: 46 % (ref 37.5–51.0)
Hemoglobin: 15.8 g/dL (ref 13.0–17.7)
Immature Grans (Abs): 0 10*3/uL (ref 0.0–0.1)
Immature Granulocytes: 0 %
Lymphocytes Absolute: 1.3 10*3/uL (ref 0.7–3.1)
Lymphs: 23 %
MCH: 31.1 pg (ref 26.6–33.0)
MCHC: 34.3 g/dL (ref 31.5–35.7)
MCV: 91 fL (ref 79–97)
Monocytes Absolute: 0.6 10*3/uL (ref 0.1–0.9)
Monocytes: 11 %
Neutrophils Absolute: 3.6 10*3/uL (ref 1.4–7.0)
Neutrophils: 63 %
Platelets: 215 10*3/uL (ref 150–450)
RBC: 5.08 x10E6/uL (ref 4.14–5.80)
RDW: 12.2 % (ref 11.6–15.4)
WBC: 5.6 10*3/uL (ref 3.4–10.8)

## 2021-09-05 LAB — CMP14+EGFR
ALT: 23 IU/L (ref 0–44)
AST: 24 IU/L (ref 0–40)
Albumin/Globulin Ratio: 1.6 (ref 1.2–2.2)
Albumin: 4.3 g/dL (ref 3.7–4.7)
Alkaline Phosphatase: 68 IU/L (ref 44–121)
BUN/Creatinine Ratio: 15 (ref 10–24)
BUN: 17 mg/dL (ref 8–27)
Bilirubin Total: 0.5 mg/dL (ref 0.0–1.2)
CO2: 25 mmol/L (ref 20–29)
Calcium: 9.4 mg/dL (ref 8.6–10.2)
Chloride: 104 mmol/L (ref 96–106)
Creatinine, Ser: 1.14 mg/dL (ref 0.76–1.27)
Globulin, Total: 2.7 g/dL (ref 1.5–4.5)
Glucose: 101 mg/dL — ABNORMAL HIGH (ref 70–99)
Potassium: 4.6 mmol/L (ref 3.5–5.2)
Sodium: 141 mmol/L (ref 134–144)
Total Protein: 7 g/dL (ref 6.0–8.5)
eGFR: 68 mL/min/{1.73_m2} (ref >59)

## 2021-09-05 LAB — LIPID PANEL
Chol/HDL Ratio: 3.1 ratio (ref 0.0–5.0)
Cholesterol, Total: 141 mg/dL (ref 100–199)
HDL: 45 mg/dL (ref >39)
LDL Chol Calc (NIH): 80 mg/dL (ref 0–99)
Triglycerides: 86 mg/dL (ref 0–149)
VLDL Cholesterol Cal: 16 mg/dL (ref 5–40)

## 2021-09-07 ENCOUNTER — Encounter: Payer: Self-pay | Admitting: Family Medicine

## 2021-09-07 ENCOUNTER — Ambulatory Visit (INDEPENDENT_AMBULATORY_CARE_PROVIDER_SITE_OTHER): Payer: Medicare Other | Admitting: Family Medicine

## 2021-09-07 VITALS — BP 132/76 | HR 66 | Temp 98.0°F | Ht 68.0 in | Wt 174.0 lb

## 2021-09-07 DIAGNOSIS — I6523 Occlusion and stenosis of bilateral carotid arteries: Secondary | ICD-10-CM

## 2021-09-07 DIAGNOSIS — Z8546 Personal history of malignant neoplasm of prostate: Secondary | ICD-10-CM

## 2021-09-07 DIAGNOSIS — N529 Male erectile dysfunction, unspecified: Secondary | ICD-10-CM

## 2021-09-07 DIAGNOSIS — N400 Enlarged prostate without lower urinary tract symptoms: Secondary | ICD-10-CM

## 2021-09-07 DIAGNOSIS — E782 Mixed hyperlipidemia: Secondary | ICD-10-CM

## 2021-09-07 MED ORDER — SILDENAFIL CITRATE 20 MG PO TABS: 20.0000 mg | ORAL_TABLET | ORAL | 1 refills | Status: DC | PRN

## 2021-09-07 MED ORDER — PRAVASTATIN SODIUM 80 MG PO TABS: 80.0000 mg | ORAL_TABLET | Freq: Every day | ORAL | 3 refills | Status: DC

## 2021-09-07 MED ORDER — FINASTERIDE 5 MG PO TABS: 5.0000 mg | ORAL_TABLET | Freq: Every day | ORAL | 3 refills | Status: DC

## 2021-09-07 MED ORDER — EZETIMIBE 10 MG PO TABS: 10.0000 mg | ORAL_TABLET | Freq: Every day | ORAL | 3 refills | Status: DC

## 2021-09-07 NOTE — Progress Notes (Signed)
BP 132/76   Pulse 66   Temp 98 F (36.7 C)   Ht $R'5\' 8"'Tg$  (1.727 m)   Wt 174 lb (78.9 kg)   SpO2 99%   BMI 26.46 kg/m    Subjective:   Patient ID: Jesse Brady, male    DOB: Oct 07, 1950, 71 y.o.   MRN: [id number]  HPI: Jesse Brady is a 71 y.o. male presenting on 09/07/2021 for Medical Management of Chronic Issues and Hyperlipidemia   HPI Hyperlipidemia and CAD and aortic atherosclerosis Patient is coming in for recheck of his hyperlipidemia. The patient is currently taking Zetia and pravastatin. They deny any issues with myalgias or history of liver damage from it. They deny any focal numbness or weakness or chest pain.   Enlarged prostate with history of prostate cancer Patient is coming in for recheck on BPH Symptoms: Weak stream but manageable with finasteride Medication: Finasteride Last PSA: 6 months ago  Patient uses sildenafil for erectile dysfunction and it does help him some so he wants to continue forward with it.  He denies any side effects or chest pain or chest tightness with it.  Patient was warned to never use nitro and sildenafil in the same day  Relevant past medical, surgical, family and social history reviewed and updated as indicated. Interim medical history since our last visit reviewed. Allergies and medications reviewed and updated.  Review of Systems  Constitutional:  Negative for chills and fever.  Eyes:  Negative for visual disturbance.  Respiratory:  Negative for shortness of breath and wheezing.   Cardiovascular:  Negative for chest pain and leg swelling.  Genitourinary:  Negative for decreased urine volume, difficulty urinating and urgency.  Musculoskeletal:  Negative for back pain and gait problem.  Skin:  Negative for rash.  Neurological:  Negative for dizziness, weakness and light-headedness.  All other systems reviewed and are negative.   Per HPI unless specifically indicated above   Allergies as of 09/07/2021   No Known  Allergies      Medication List        Accurate as of September 07, 2021  8:18 AM. If you have any questions, ask your nurse or doctor.          aspirin EC 81 MG tablet Take 81 mg by mouth daily.   co-enzyme Q-10 30 MG capsule Take 30 mg by mouth daily.   ezetimibe 10 MG tablet Commonly known as: ZETIA Take 1 tablet (10 mg total) by mouth daily.   finasteride 5 MG tablet Commonly known as: PROSCAR Take 1 tablet (5 mg total) by mouth daily.   Fish Oil 1000 MG Cpdr Take 1 capsule by mouth daily.   metroNIDAZOLE 1 % gel Commonly known as: Metrogel Apply topically daily.   multivitamin with minerals Tabs tablet Take 1 tablet by mouth daily.   nitroGLYCERIN 0.4 MG SL tablet Commonly known as: Nitrostat Place 1 tablet (0.4 mg total) under the tongue every 5 (five) minutes as needed for chest pain.   pravastatin 80 MG tablet Commonly known as: PRAVACHOL Take 1 tablet (80 mg total) by mouth daily.   sildenafil 20 MG tablet Commonly known as: REVATIO Take 1-3 tablets (20-60 mg total) by mouth as needed.         Objective:   BP 132/76   Pulse 66   Temp 98 F (36.7 C)   Ht $R'5\' 8"'Dr$  (1.727 m)   Wt 174 lb (78.9 kg)   SpO2 99%   BMI 26.46  kg/m   Wt Readings from Last 3 Encounters:  09/07/21 174 lb (78.9 kg)  03/09/21 176 lb (79.8 kg)  02/17/21 170 lb (77.1 kg)    Physical Exam Vitals and nursing note reviewed.  Constitutional:      General: He is not in acute distress.    Appearance: He is well-developed. He is not diaphoretic.  Eyes:     General: No scleral icterus.    Conjunctiva/sclera: Conjunctivae normal.  Neck:     Thyroid: No thyromegaly.  Cardiovascular:     Rate and Rhythm: Normal rate and regular rhythm.     Heart sounds: Normal heart sounds. No murmur heard. Pulmonary:     Effort: Pulmonary effort is normal. No respiratory distress.     Breath sounds: Normal breath sounds. No wheezing.  Musculoskeletal:        General: Normal range of  motion.     Cervical back: Neck supple.  Lymphadenopathy:     Cervical: No cervical adenopathy.  Skin:    General: Skin is warm and dry.     Findings: No rash.  Neurological:     Mental Status: He is alert and oriented to person, place, and time.     Coordination: Coordination normal.  Psychiatric:        Behavior: Behavior normal.     Results for orders placed or performed in visit on 09/04/21  CMP14+EGFR  Result Value Ref Range   Glucose 101 (H) 70 - 99 mg/dL   BUN 17 8 - 27 mg/dL   Creatinine, Ser 1.14 0.76 - 1.27 mg/dL   eGFR 68 >59 mL/min/1.73   BUN/Creatinine Ratio 15 10 - 24   Sodium 141 134 - 144 mmol/L   Potassium 4.6 3.5 - 5.2 mmol/L   Chloride 104 96 - 106 mmol/L   CO2 25 20 - 29 mmol/L   Calcium 9.4 8.6 - 10.2 mg/dL   Total Protein 7.0 6.0 - 8.5 g/dL   Albumin 4.3 3.7 - 4.7 g/dL   Globulin, Total 2.7 1.5 - 4.5 g/dL   Albumin/Globulin Ratio 1.6 1.2 - 2.2   Bilirubin Total 0.5 0.0 - 1.2 mg/dL   Alkaline Phosphatase 68 44 - 121 IU/L   AST 24 0 - 40 IU/L   ALT 23 0 - 44 IU/L  CBC with Differential/Platelet  Result Value Ref Range   WBC 5.6 3.4 - 10.8 x10E3/uL   RBC 5.08 4.14 - 5.80 x10E6/uL   Hemoglobin 15.8 13.0 - 17.7 g/dL   Hematocrit 46.0 37.5 - 51.0 %   MCV 91 79 - 97 fL   MCH 31.1 26.6 - 33.0 pg   MCHC 34.3 31.5 - 35.7 g/dL   RDW 12.2 11.6 - 15.4 %   Platelets 215 150 - 450 x10E3/uL   Neutrophils 63 Not Estab. %   Lymphs 23 Not Estab. %   Monocytes 11 Not Estab. %   Eos 2 Not Estab. %   Basos 1 Not Estab. %   Neutrophils Absolute 3.6 1.4 - 7.0 x10E3/uL   Lymphocytes Absolute 1.3 0.7 - 3.1 x10E3/uL   Monocytes Absolute 0.6 0.1 - 0.9 x10E3/uL   EOS (ABSOLUTE) 0.1 0.0 - 0.4 x10E3/uL   Basophils Absolute 0.0 0.0 - 0.2 x10E3/uL   Immature Granulocytes 0 Not Estab. %   Immature Grans (Abs) 0.0 0.0 - 0.1 x10E3/uL  Lipid panel  Result Value Ref Range   Cholesterol, Total 141 100 - 199 mg/dL   Triglycerides 86 0 - 149 mg/dL  HDL 45 >39 mg/dL    VLDL Cholesterol Cal 16 5 - 40 mg/dL   LDL Chol Calc (NIH) 80 0 - 99 mg/dL   Chol/HDL Ratio 3.1 0.0 - 5.0 ratio    Assessment & Plan:   Problem List Items Addressed This Visit       Cardiovascular and Mediastinum   Carotid artery disease (HCC)   Relevant Medications   pravastatin (PRAVACHOL) 80 MG tablet   ezetimibe (ZETIA) 10 MG tablet   sildenafil (REVATIO) 20 MG tablet     Other   Hyperlipidemia   Relevant Medications   pravastatin (PRAVACHOL) 80 MG tablet   ezetimibe (ZETIA) 10 MG tablet   sildenafil (REVATIO) 20 MG tablet   Prostate hypertrophy - Primary   History of prostate cancer   Relevant Medications   finasteride (PROSCAR) 5 MG tablet   Other Visit Diagnoses     Erectile dysfunction, unspecified erectile dysfunction type       Relevant Medications   sildenafil (REVATIO) 20 MG tablet       Continue current medicine, no changes.  Please make sure to never use nitro and sildenafil in the same day Follow up plan: Return in about 6 months (around 03/09/2022), or if symptoms worsen or fail to improve, for physical.  Counseling provided for all of the vaccine components No orders of the defined types were placed in this encounter.   Caryl Pina, MD [facility] 09/07/2021, 8:18 AM

## 2021-09-26 NOTE — Progress Notes (Unsigned)
Cardiology Office Note  Date: 09/30/2021   ID: Jesse Brady, DOB May 28, 1950, MRN [id number]  PCP:  Dettinger, Fransisca Kaufmann, MD  Cardiologist:  Rozann Lesches, MD Electrophysiologist:  None   Chief Complaint  Patient presents with   Cardiac follow-up    History of Present Illness: Jesse Brady is a 71 y.o. male last seen in October 2020.  He presents for a follow-up visit.  Does not report any angina or nitroglycerin use, but states that his stamina has generally decreased over the last few years.  He has to rest more in the afternoons.  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  Has not been exercising regularly.  I reviewed his medications which are noted below.  He is on Pravachol and Zetia with most recent LDL 80.  Did discuss his diet, in the past he had been on Lipitor and seemingly tolerated it until a switch was made by insurance.  He did not tolerate Crestor due to myalgias.  I reviewed his ECG today which shows sinus rhythm with increased voltage.  Follow-up ischemic testing in 2019 was low risk as noted below.  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     atorvastatin (LIPITOR) 40 MG tablet Take 1 tablet (40 mg total) by mouth daily. 30 tablet 3   co-enzyme Q-10 30 MG capsule Take 30 mg by mouth daily.     ezetimibe (ZETIA) 10 MG tablet Take 1 tablet (10 mg total) by mouth daily. 90 tablet 3   finasteride (PROSCAR) 5 MG tablet Take 1 tablet (5 mg total) by mouth daily. 90 tablet 3   metroNIDAZOLE (METROGEL) 1 % gel Apply topically daily. 45 g 0   Multiple Vitamin (MULTIVITAMIN WITH MINERALS) TABS tablet Take 1 tablet by mouth daily.     nitroGLYCERIN (NITROSTAT) 0.4 MG SL tablet Place 1 tablet (0.4 mg total) under the tongue every 5 (five) minutes as needed for chest pain. 25 tablet 2   Omega-3 Fatty Acids (FISH OIL) 1000 MG CPDR Take 1 capsule by mouth daily.     sildenafil (REVATIO) 20 MG tablet Take 1-3 tablets (20-60 mg total) by mouth as needed. 50 tablet 1   No current facility-administered medications for this visit.   Allergies:  Patient has no known allergies.   ROS: No palpitations or syncope.  Physical Exam: VS:  BP 126/72   Pulse 67   Ht '5\' 8"'$  (1.727 m)   Wt 175 lb (79.4 kg)   SpO2 94%   BMI  26.61 kg/m , BMI Body mass index is 26.61 kg/m.  Wt Readings from Last 3 Encounters:  09/30/21 175 lb (79.4 kg)  09/07/21 174 lb (78.9 kg)  03/09/21 176 lb (79.8 kg)    General: Patient appears comfortable at rest. HEENT: Conjunctiva and lids normal, oropharynx clear. Neck: Supple, no elevated JVP or carotid bruits, no thyromegaly. Lungs: Clear to auscultation, nonlabored breathing at rest. Cardiac: Regular rate and rhythm, no S3 or significant systolic murmur, no pericardial rub. Extremities: No pitting edema.  ECG:  An ECG dated 06/08/2016 was personally reviewed today and demonstrated:  Sinus rhythm with R' in lead V1 and V2.  Recent Labwork: 09/04/2021: ALT 23; AST 24; BUN 17; Creatinine, Ser 1.14; Hemoglobin 15.8; Platelets 215; Potassium 4.6; Sodium 141     Component Value  Date/Time   CHOL 141 09/04/2021 0806   TRIG 86 09/04/2021 0806   HDL 45 09/04/2021 0806   CHOLHDL 3.1 09/04/2021 0806   CHOLHDL 6.1 03/12/2013 2327   VLDL 59 (H) 03/12/2013 2327   LDLCALC 80 09/04/2021 0806    Other Studies Reviewed Today:  Cardiac catheterization 04/12/2014: HEMODYNAMICS:  Aortic pressure was 95/62; LV pressure was 98/4; LVEDP 7.  There was no gradient between the left ventricle and aorta.     ANGIOGRAPHIC DATA:   The left main coronary artery is widely patent.   The left anterior descending artery is a medium-size vessel. There is moderate disease proximally. In the mid vessel, there is a 99% stenosis resulting in TIMI 2 flow in the remainder of the vessel. There are 2 medium-size diagonals, both of which are widely patent. One originates before the severe stenosis and one originates after that. Both are patent.   The left circumflex artery is a large vessel. The first obtuse marginal is a large vessel which is widely patent. The second obtuse marginal is also a large vessel which is widely patent. The remainder of the circumflex is patent.   The right coronary artery is a medium size, dominant vessel. There is moderate disease in the proximal vessel. There is mild disease in the mid vessel and a mild focal lesion in the distal vessel. The posterior lateral artery is medium-sized and patent. The posterior descending artery a small and patent.   LEFT VENTRICULOGRAM:  Left ventricular angiogram was done in the 30 RAO projection and revealed normal left ventricular wall motion and systolic function with an estimated ejection fraction of 60%.  LVEDP was 7 mmHg.   PCI NARRATIVE: A CLS 3.0 guiding catheters using his left main. IV heparin and IV tirofiban were used for anticoagulation. ACT was used to check that the anticoagulation was therapeutic. A fielder XT wire was placed across the area of disease in the LAD. There was difficulty in getting the fielder wire into the LAD  due to an acute angle.  A 2.0 x 20 balloon was used to predilate the entire area of disease, which was a long segment. A 2.25 x 38 Synergy drug-eluting stent was deployed across the entire area disease. The stent was postdilated with a 2.5 x 15 noncompliant balloon to high pressure. The patient tolerated the procedure well. There was an excellent angiographic result.   IMPRESSIONS:   Normal left main coronary artery. 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  This was successfully treated with a 2.25 x 38 Synergy drug-eluting stent, postdilated to 2.6 mm in diameter. Widely patent  left circumflex artery and its branches. Mild disease in the right  coronary artery. Normal left ventricular systolic function.  LVEDP 8 mmHg.  Ejection fraction 60%.   Lexiscan Myoview 07/27/2017: There was no ST segment deviation noted during stress. Defect 1: There is a medium defect of mild severity present in the mid inferoseptal and mid inferior location. This appears to be due to soft tissue attenuation artifact. There are no significant ischemic territories. This is a low risk study. Nuclear stress EF: 68%.  Assessment and Plan:  1.  CAD status post DES to the LAD in January 2016.  No definite angina or nitroglycerin use, but does report less stamina with activity as discussed above.  ECG reviewed and stable.  His last formal ischemic testing was in 2019.  We will arrange a follow-up exercise Myoview on medical therapy to assess ischemic burden.    2.  Mixed hyperlipidemia, on Pravachol 80 mg daily and Zetia with LDL 80.  We discussed attempting a switch from Pravachol to Lipitor 40 mg daily to see if we can get LDL closer to 60.  If tolerated, recheck FLP in 3 months.  3.  Asymptomatic, mild carotid artery disease.  Continue aspirin and statin.  Medication Adjustments/Labs and Tests Ordered: Current medicines are reviewed at length with the patient today.  Concerns regarding medicines are  outlined above.   Tests Ordered: Orders Placed This Encounter  Procedures   NM Myocar Multi W/Spect W/Wall Motion / EF   EKG 12-Lead    Medication Changes: Meds ordered this encounter  Medications   atorvastatin (LIPITOR) 40 MG tablet    Sig: Take 1 tablet (40 mg total) by mouth daily.    Dispense:  30 tablet    Refill:  3    Stopping Pravastatin, med changed 09/30/2021    Disposition:  Follow up  test results and determine disposition.  Signed, Satira Sark, MD, [facility] 09/30/2021 9:07 AM    [facility] [place], [place], [place] [address] Phone: [phone]; Fax: [phone]

## 2021-09-30 ENCOUNTER — Telehealth: Payer: Self-pay | Admitting: Cardiology

## 2021-09-30 ENCOUNTER — Encounter: Payer: Self-pay | Admitting: *Deleted

## 2021-09-30 ENCOUNTER — Ambulatory Visit: Payer: Medicare Other | Admitting: Cardiology

## 2021-09-30 ENCOUNTER — Encounter: Payer: Self-pay | Admitting: Cardiology

## 2021-09-30 VITALS — BP 126/72 | HR 67 | Ht 68.0 in | Wt 175.0 lb

## 2021-09-30 DIAGNOSIS — E782 Mixed hyperlipidemia: Secondary | ICD-10-CM | POA: Diagnosis not present

## 2021-09-30 DIAGNOSIS — I25119 Atherosclerotic heart disease of native coronary artery with unspecified angina pectoris: Secondary | ICD-10-CM | POA: Diagnosis not present

## 2021-09-30 MED ORDER — ATORVASTATIN CALCIUM 40 MG PO TABS: 40.0000 mg | ORAL_TABLET | Freq: Every day | ORAL | 3 refills | Status: DC

## 2021-09-30 NOTE — Telephone Encounter (Signed)
Checking percert on the following patient for testing scheduled at [facility].    Exercise myoview - 10/12/2021

## 2021-09-30 NOTE — Patient Instructions (Addendum)
Medication Instructions:  Stop Pravastatin (Pravachol) Begin Lipitor '40mg'$  daily  Call the office if unable to tolerate.  Continue all other medications.     Labwork: FLP - due in 3 months if able to tolerate the Lipitor.  Please notify office when ready for the labs.    Testing/Procedures: Your physician has requested that you have en exercise stress myoview. For further information please visit [email or web address]. Please follow instruction sheet, as given.   Follow-Up: Office will contact with results via phone or letter.    Pending test results   Any Other Special Instructions Will Be Listed Below (If Applicable).   If you need a refill on your cardiac medications before your next appointment, please call your pharmacy.

## 2021-10-12 ENCOUNTER — Encounter (HOSPITAL_COMMUNITY)
Admission: RE | Admit: 2021-10-12 | Discharge: 2021-10-12 | Disposition: A | Discharge status: Home / Self Care | Payer: Medicare Other | Source: Ambulatory Visit | Attending: Cardiology | Admitting: Cardiology

## 2021-10-12 ENCOUNTER — Ambulatory Visit (HOSPITAL_COMMUNITY)
Admission: RE | Admit: 2021-10-12 | Discharge: 2021-10-12 | Disposition: A | Discharge status: Home / Self Care | Payer: Medicare Other | Source: Ambulatory Visit | Attending: Cardiology | Admitting: Cardiology

## 2021-10-12 DIAGNOSIS — I25119 Atherosclerotic heart disease of native coronary artery with unspecified angina pectoris: Secondary | ICD-10-CM | POA: Insufficient documentation

## 2021-10-12 LAB — NM MYOCAR MULTI W/SPECT W/WALL MOTION / EF
Angina Index: 0
Duke Treadmill Score: 11
Estimated workload: 13.4
Exercise duration (min): 11 min
Exercise duration (sec): 1 s
LV dias vol: 88 mL (ref 62–150)
LV sys vol: 31 mL
MPHR: 148 {beats}/min
Nuc Stress EF: 65 %
Peak HR: 139 {beats}/min
Percent HR: 93 %
RATE: 0.4
RPE: 17
Rest HR: 67 {beats}/min
Rest Nuclear Isotope Dose: 10.8 mCi
SDS: 0
SRS: 2
SSS: 2
ST Depression (mm): 0 mm
Stress Nuclear Isotope Dose: 32.8 mCi
TID: 0.89

## 2021-10-12 MED ORDER — SODIUM CHLORIDE FLUSH 0.9 % IV SOLN
INTRAVENOUS | Status: AC
  Administered 2021-10-12: 3 mL
  Filled 2021-10-12: qty 10

## 2021-10-12 MED ORDER — TECHNETIUM TC 99M TETROFOSMIN IV KIT
10.0000 | PACK | Freq: Once | INTRAVENOUS | Status: AC | PRN
  Administered 2021-10-12: 10.75 via INTRAVENOUS

## 2021-10-12 MED ORDER — TECHNETIUM TC 99M TETROFOSMIN IV KIT
30.0000 | PACK | Freq: Once | INTRAVENOUS | Status: AC | PRN
  Administered 2021-10-12: 32.8 via INTRAVENOUS

## 2021-10-12 MED ORDER — REGADENOSON 0.4 MG/5ML IV SOLN
INTRAVENOUS | Status: AC
  Filled 2021-10-12: qty 5

## 2021-11-25 DIAGNOSIS — L812 Freckles: Secondary | ICD-10-CM | POA: Diagnosis not present

## 2021-11-25 DIAGNOSIS — Z85828 Personal history of other malignant neoplasm of skin: Secondary | ICD-10-CM | POA: Diagnosis not present

## 2021-11-25 DIAGNOSIS — L821 Other seborrheic keratosis: Secondary | ICD-10-CM | POA: Diagnosis not present

## 2021-12-30 ENCOUNTER — Telehealth: Payer: Self-pay | Admitting: Cardiology

## 2021-12-30 ENCOUNTER — Other Ambulatory Visit: Payer: Self-pay | Admitting: *Deleted

## 2021-12-30 ENCOUNTER — Encounter: Payer: Self-pay | Admitting: Cardiology

## 2021-12-30 DIAGNOSIS — Z79899 Other long term (current) drug therapy: Secondary | ICD-10-CM

## 2021-12-30 DIAGNOSIS — E782 Mixed hyperlipidemia: Secondary | ICD-10-CM

## 2021-12-30 NOTE — Telephone Encounter (Signed)
  Pt said, he needs labs for liver functional and cholesterol. He would like to get the labs done at [facility] [place]

## 2021-12-30 NOTE — Telephone Encounter (Signed)
Error

## 2021-12-30 NOTE — Telephone Encounter (Signed)
Orders placed and patient notified via mychart

## 2022-01-04 ENCOUNTER — Other Ambulatory Visit: Payer: Medicare Other

## 2022-01-04 DIAGNOSIS — H4311 Vitreous hemorrhage, right eye: Secondary | ICD-10-CM | POA: Diagnosis not present

## 2022-01-04 DIAGNOSIS — H43813 Vitreous degeneration, bilateral: Secondary | ICD-10-CM | POA: Diagnosis not present

## 2022-01-04 DIAGNOSIS — H2513 Age-related nuclear cataract, bilateral: Secondary | ICD-10-CM | POA: Diagnosis not present

## 2022-01-04 DIAGNOSIS — E782 Mixed hyperlipidemia: Secondary | ICD-10-CM | POA: Diagnosis not present

## 2022-01-04 DIAGNOSIS — Z79899 Other long term (current) drug therapy: Secondary | ICD-10-CM | POA: Diagnosis not present

## 2022-01-05 ENCOUNTER — Encounter: Payer: Self-pay | Admitting: Cardiology

## 2022-01-05 LAB — LIPID PANEL
Chol/HDL Ratio: 2.4 ratio (ref 0.0–5.0)
Cholesterol, Total: 110 mg/dL (ref 100–199)
HDL: 46 mg/dL (ref >39)
LDL Chol Calc (NIH): 52 mg/dL (ref 0–99)
Triglycerides: 53 mg/dL (ref 0–149)
VLDL Cholesterol Cal: 12 mg/dL (ref 5–40)

## 2022-01-06 ENCOUNTER — Other Ambulatory Visit: Payer: Self-pay | Admitting: *Deleted

## 2022-01-06 MED ORDER — ATORVASTATIN CALCIUM 40 MG PO TABS: 40.0000 mg | ORAL_TABLET | Freq: Every day | ORAL | 3 refills | Status: DC

## 2022-03-09 ENCOUNTER — Other Ambulatory Visit: Payer: Medicare Other

## 2022-03-09 DIAGNOSIS — E782 Mixed hyperlipidemia: Secondary | ICD-10-CM

## 2022-03-10 LAB — CBC WITH DIFFERENTIAL/PLATELET
Basophils Absolute: 0 10*3/uL (ref 0.0–0.2)
Basos: 1 %
EOS (ABSOLUTE): 0.2 10*3/uL (ref 0.0–0.4)
Eos: 3 %
Hematocrit: 45.8 % (ref 37.5–51.0)
Hemoglobin: 15.6 g/dL (ref 13.0–17.7)
Immature Grans (Abs): 0 10*3/uL (ref 0.0–0.1)
Immature Granulocytes: 0 %
Lymphocytes Absolute: 1.2 10*3/uL (ref 0.7–3.1)
Lymphs: 22 %
MCH: 30.8 pg (ref 26.6–33.0)
MCHC: 34.1 g/dL (ref 31.5–35.7)
MCV: 91 fL (ref 79–97)
Monocytes Absolute: 0.6 10*3/uL (ref 0.1–0.9)
Monocytes: 10 %
Neutrophils Absolute: 3.7 10*3/uL (ref 1.4–7.0)
Neutrophils: 64 %
Platelets: 200 10*3/uL (ref 150–450)
RBC: 5.06 x10E6/uL (ref 4.14–5.80)
RDW: 12 % (ref 11.6–15.4)
WBC: 5.7 10*3/uL (ref 3.4–10.8)

## 2022-03-10 LAB — CMP14+EGFR
ALT: 27 IU/L (ref 0–44)
AST: 25 IU/L (ref 0–40)
Albumin/Globulin Ratio: 2.1 (ref 1.2–2.2)
Albumin: 4.4 g/dL (ref 3.8–4.8)
Alkaline Phosphatase: 72 IU/L (ref 44–121)
BUN/Creatinine Ratio: 24 (ref 10–24)
BUN: 22 mg/dL (ref 8–27)
Bilirubin Total: 0.6 mg/dL (ref 0.0–1.2)
CO2: 25 mmol/L (ref 20–29)
Calcium: 9.1 mg/dL (ref 8.6–10.2)
Chloride: 103 mmol/L (ref 96–106)
Creatinine, Ser: 0.93 mg/dL (ref 0.76–1.27)
Globulin, Total: 2.1 g/dL (ref 1.5–4.5)
Glucose: 114 mg/dL — ABNORMAL HIGH (ref 70–99)
Potassium: 5.3 mmol/L — ABNORMAL HIGH (ref 3.5–5.2)
Sodium: 141 mmol/L (ref 134–144)
Total Protein: 6.5 g/dL (ref 6.0–8.5)
eGFR: 87 mL/min/{1.73_m2} (ref >59)

## 2022-03-10 LAB — PSA, TOTAL AND FREE
PSA, Free Pct: 29 %
PSA, Free: 0.29 ng/mL
Prostate Specific Ag, Serum: 1 ng/mL (ref 0.0–4.0)

## 2022-03-10 LAB — LIPASE: Lipase: 51 U/L (ref 13–78)

## 2022-03-11 ENCOUNTER — Encounter: Payer: Self-pay | Admitting: Family Medicine

## 2022-03-11 ENCOUNTER — Ambulatory Visit (INDEPENDENT_AMBULATORY_CARE_PROVIDER_SITE_OTHER): Payer: Medicare Other | Admitting: Family Medicine

## 2022-03-11 VITALS — BP 138/68 | HR 61 | Temp 97.1°F | Ht 68.0 in | Wt 173.0 lb

## 2022-03-11 DIAGNOSIS — Z23 Encounter for immunization: Secondary | ICD-10-CM

## 2022-03-11 DIAGNOSIS — Z0001 Encounter for general adult medical examination with abnormal findings: Secondary | ICD-10-CM

## 2022-03-11 DIAGNOSIS — E782 Mixed hyperlipidemia: Secondary | ICD-10-CM

## 2022-03-11 DIAGNOSIS — I6523 Occlusion and stenosis of bilateral carotid arteries: Secondary | ICD-10-CM | POA: Diagnosis not present

## 2022-03-11 DIAGNOSIS — Z0000 Encounter for general adult medical examination without abnormal findings: Secondary | ICD-10-CM

## 2022-03-11 DIAGNOSIS — N400 Enlarged prostate without lower urinary tract symptoms: Secondary | ICD-10-CM | POA: Diagnosis not present

## 2022-03-11 DIAGNOSIS — Z8546 Personal history of malignant neoplasm of prostate: Secondary | ICD-10-CM

## 2022-03-11 NOTE — Progress Notes (Signed)
BP 138/68   Pulse 61   Temp (!) 97.1 F (36.2 C)   Ht _0  (1.727 m)   Wt 173 lb (78.5 kg)   SpO2 99%   BMI 26.30 kg/m    Subjective:   Patient ID: Jesse Brady, male    DOB: 12-22-1950, 71 y.o.   MRN: [id number]  HPI: Jesse Brady is a 71 y.o. male presenting on 03/11/2022 for Medical Management of Chronic Issues (CPE)   HPI Physical exam Patient denies any chest pain, shortness of breath, headaches or vision issues, abdominal complaints, diarrhea, nausea, vomiting, or joint issues.   Hyperlipidemia and CAD Patient is coming in for recheck of his hyperlipidemia. The patient is currently taking atorvastatin, also sees cardiology. They deny any issues with myalgias or history of liver damage from it. They deny any focal numbness or weakness or chest pain.   Prostate hypertrophy with history of possible cancer Patient is coming in for recheck on BPH Symptoms: None currently Medication: Finasteride Last PSA: 1.0  Relevant past medical, surgical, family and social history reviewed and updated as indicated. Interim medical history since our last visit reviewed. Allergies and medications reviewed and updated.  Review of Systems  Constitutional:  Negative for chills and fever.  HENT:  Negative for ear pain and tinnitus.   Eyes:  Negative for pain.  Respiratory:  Negative for cough, shortness of breath and wheezing.   Cardiovascular:  Negative for chest pain, palpitations and leg swelling.  Gastrointestinal:  Negative for abdominal pain, blood in stool, constipation and diarrhea.  Genitourinary:  Negative for dysuria and hematuria.  Musculoskeletal:  Negative for back pain and myalgias.  Skin:  Negative for rash.  Neurological:  Negative for dizziness, weakness and headaches.  Psychiatric/Behavioral:  Negative for suicidal ideas.     Per HPI unless specifically indicated above   Allergies as of 03/11/2022   No Known Allergies      Medication List         Accurate as of March 11, 2022  8:25 AM. If you have any questions, ask your nurse or doctor.          aspirin EC 81 MG tablet Take 81 mg by mouth daily.   atorvastatin 40 MG tablet Commonly known as: LIPITOR Take 1 tablet (40 mg total) by mouth daily.   co-enzyme Q-10 30 MG capsule Take 30 mg by mouth daily.   ezetimibe 10 MG tablet Commonly known as: ZETIA Take 1 tablet (10 mg total) by mouth daily.   finasteride 5 MG tablet Commonly known as: PROSCAR Take 1 tablet (5 mg total) by mouth daily.   Fish Oil 1000 MG Cpdr Take 1 capsule by mouth daily.   metroNIDAZOLE 1 % gel Commonly known as: Metrogel Apply topically daily.   multivitamin with minerals Tabs tablet Take 1 tablet by mouth daily.   nitroGLYCERIN 0.4 MG SL tablet Commonly known as: Nitrostat Place 1 tablet (0.4 mg total) under the tongue every 5 (five) minutes as needed for chest pain.   sildenafil 20 MG tablet Commonly known as: REVATIO Take 1-3 tablets (20-60 mg total) by mouth as needed.         Objective:   BP 138/68   Pulse 61   Temp (!) 97.1 F (36.2 C)   Ht _1  (1.727 m)   Wt 173 lb (78.5 kg)   SpO2 99%   BMI 26.30 kg/m   Wt Readings from Last 3 Encounters:  03/11/22 173  lb (78.5 kg)  09/30/21 175 lb (79.4 kg)  09/07/21 174 lb (78.9 kg)    Physical Exam Vitals reviewed.  Constitutional:      General: He is not in acute distress.    Appearance: He is well-developed. He is not diaphoretic.  HENT:     Right Ear: External ear normal.     Left Ear: External ear normal.     Nose: Nose normal.     Mouth/Throat:     Pharynx: No oropharyngeal exudate.  Eyes:     General: No scleral icterus.       Right eye: No discharge.     Conjunctiva/sclera: Conjunctivae normal.     Pupils: Pupils are equal, round, and reactive to light.  Neck:     Thyroid: No thyromegaly.  Cardiovascular:     Rate and Rhythm: Normal rate and regular rhythm.     Heart sounds: Normal heart sounds. No  murmur heard. Pulmonary:     Effort: Pulmonary effort is normal. No respiratory distress.     Breath sounds: Normal breath sounds. No wheezing.  Abdominal:     General: Bowel sounds are normal. There is no distension.     Palpations: Abdomen is soft.     Tenderness: There is no abdominal tenderness. There is no guarding or rebound.  Musculoskeletal:        General: Normal range of motion.     Cervical back: Neck supple.  Lymphadenopathy:     Cervical: No cervical adenopathy.  Skin:    General: Skin is warm and dry.     Findings: No rash.  Neurological:     Mental Status: He is alert and oriented to person, place, and time.     Coordination: Coordination normal.  Psychiatric:        Behavior: Behavior normal.     Results for orders placed or performed in visit on 03/09/22  CBC with Differential/Platelet  Result Value Ref Range   WBC 5.7 3.4 - 10.8 x10E3/uL   RBC 5.06 4.14 - 5.80 x10E6/uL   Hemoglobin 15.6 13.0 - 17.7 g/dL   Hematocrit 45.8 37.5 - 51.0 %   MCV 91 79 - 97 fL   MCH 30.8 26.6 - 33.0 pg   MCHC 34.1 31.5 - 35.7 g/dL   RDW 12.0 11.6 - 15.4 %   Platelets 200 150 - 450 x10E3/uL   Neutrophils 64 Not Estab. %   Lymphs 22 Not Estab. %   Monocytes 10 Not Estab. %   Eos 3 Not Estab. %   Basos 1 Not Estab. %   Neutrophils Absolute 3.7 1.4 - 7.0 x10E3/uL   Lymphocytes Absolute 1.2 0.7 - 3.1 x10E3/uL   Monocytes Absolute 0.6 0.1 - 0.9 x10E3/uL   EOS (ABSOLUTE) 0.2 0.0 - 0.4 x10E3/uL   Basophils Absolute 0.0 0.0 - 0.2 x10E3/uL   Immature Granulocytes 0 Not Estab. %   Immature Grans (Abs) 0.0 0.0 - 0.1 x10E3/uL  CMP14+EGFR  Result Value Ref Range   Glucose 114 (H) 70 - 99 mg/dL   BUN 22 8 - 27 mg/dL   Creatinine, Ser 0.93 0.76 - 1.27 mg/dL   eGFR 87 >59 mL/min/1.73   BUN/Creatinine Ratio 24 10 - 24   Sodium 141 134 - 144 mmol/L   Potassium 5.3 (H) 3.5 - 5.2 mmol/L   Chloride 103 96 - 106 mmol/L   CO2 25 20 - 29 mmol/L   Calcium 9.1 8.6 - 10.2 mg/dL   Total  Protein 6.5  6.0 - 8.5 g/dL   Albumin 4.4 3.8 - 4.8 g/dL   Globulin, Total 2.1 1.5 - 4.5 g/dL   Albumin/Globulin Ratio 2.1 1.2 - 2.2   Bilirubin Total 0.6 0.0 - 1.2 mg/dL   Alkaline Phosphatase 72 44 - 121 IU/L   AST 25 0 - 40 IU/L   ALT 27 0 - 44 IU/L  Lipase  Result Value Ref Range   Lipase 51 13 - 78 U/L  PSA, total and free  Result Value Ref Range   Prostate Specific Ag, Serum 1.0 0.0 - 4.0 ng/mL   PSA, Free 0.29 N/A ng/mL   PSA, Free Pct 29.0 %    Assessment & Plan:   Problem List Items Addressed This Visit       Cardiovascular and Mediastinum   Carotid artery disease ([place])     Other   Hyperlipidemia   Prostate hypertrophy   History of prostate cancer   Other Visit Diagnoses     Physical exam    -  Primary     Blood work looks good for the most part except for borderline elevated blood sugar.  No changes, continue follow-up with cardiology as well.  PSA looks better as well.  Follow up plan: Return in about 6 months (around 09/10/2022), or if symptoms worsen or fail to improve, for Hyperlipidemia and prostate recheck.  Counseling provided for all of the vaccine components No orders of the defined types were placed in this encounter.   Caryl Pina, MD [facility] 03/11/2022, 8:25 AM

## 2022-04-01 ENCOUNTER — Telehealth: Payer: Self-pay | Admitting: Family Medicine

## 2022-04-01 DIAGNOSIS — I6523 Occlusion and stenosis of bilateral carotid arteries: Secondary | ICD-10-CM

## 2022-04-01 MED ORDER — NITROGLYCERIN 0.4 MG SL SUBL: 0.4000 mg | SUBLINGUAL_TABLET | SUBLINGUAL | 3 refills | Status: DC | PRN

## 2022-04-01 NOTE — Telephone Encounter (Signed)
Yes go ahead and send nitroglycerin 0.4 take 1 as needed for chest pain, I believe it is usually #25 in  a bottle with 3 refills for the patient

## 2022-04-01 NOTE — Telephone Encounter (Signed)
  Prescription Request  04/01/2022  Is this a "Controlled Substance" medicine? no  Have you seen your PCP in the last 2 weeks? No pt has follow up with PCP on 09/10/2022  If YES, route message to pool  -  If NO, patient needs to be scheduled for appointment.  What is the name of the medication or equipment? nitroGLYCERIN (NITROSTAT) 0.4 MG SL tablet   Have you contacted your pharmacy to request a refill? yes   Which pharmacy would you like this sent to? [facility]   Patient notified that their request is being sent to the clinical staff for review and that they should receive a response within 2 business days.

## 2022-04-01 NOTE — Telephone Encounter (Signed)
RX refill sent in per note from Dettinger - pt notified

## 2022-04-09 ENCOUNTER — Telehealth: Payer: Self-pay | Admitting: Family Medicine

## 2022-04-09 NOTE — Telephone Encounter (Signed)
Left message for patient to call back and schedule Medicare Annual Wellness Visit (AWV) to be completed by video or phone.   Last AWV: 02/17/2021   Please schedule at anytime with [place]     Any questions, please contact me at [phone]   Thank you,   [facility] Clinical Support for [place] [facility] ??[phone] or ??[phone]

## 2022-05-03 ENCOUNTER — Ambulatory Visit (INDEPENDENT_AMBULATORY_CARE_PROVIDER_SITE_OTHER): Payer: Medicare Other

## 2022-05-03 VITALS — Ht 68.0 in | Wt 173.0 lb

## 2022-05-03 DIAGNOSIS — Z0000 Encounter for general adult medical examination without abnormal findings: Secondary | ICD-10-CM | POA: Diagnosis not present

## 2022-05-03 NOTE — Progress Notes (Signed)
Subjective:   Jesse Brady is a 72 y.o. male who presents for Medicare Annual/Subsequent preventive examination.  I connected with  Aloise Shellman Bensinger on 05/03/22 by a audio enabled telemedicine application and verified that I am speaking with the correct person using two identifiers.  Patient Location: Home  Provider Location: Home Office  I discussed the limitations of evaluation and management by telemedicine. The patient expressed understanding and agreed to proceed.  Review of Systems     Cardiac Risk Factors include: advanced age (>52mn, >>71women);male gender;dyslipidemia     Objective:    Today's Vitals   05/03/22 1440  Weight: 173 lb (78.5 kg)  Height: 5' 8"$  (1.727 m)   Body mass index is 26.3 kg/m.     05/03/2022    5:06 PM 02/17/2021    4:16 PM 04/12/2014    6:49 AM 03/12/2013    9:00 PM  Advanced Directives  Does Patient Have a Medical Advance Directive? No;Yes Yes No Patient does not have advance directive  Type of Advance Directive [place] will [place] will    Does patient want to make changes to medical advance directive? No - Patient declined     Copy of [place]?  No - copy requested    Would patient like information on creating a medical advance directive? No - Patient declined  Yes - Educational materials given     Current Medications (verified) Outpatient Encounter Medications as of 05/03/2022  Medication Sig   aspirin EC 81 MG tablet Take 81 mg by mouth daily.   atorvastatin (LIPITOR) 40 MG tablet Take 1 tablet (40 mg total) by mouth daily.   co-enzyme Q-10 30 MG capsule Take 30 mg by mouth daily.   ezetimibe (ZETIA) 10 MG tablet Take 1 tablet (10 mg total) by mouth daily.   finasteride (PROSCAR) 5 MG tablet Take 1 tablet (5 mg total) by mouth daily.   metroNIDAZOLE (METROGEL) 1 % gel Apply topically daily.   Multiple Vitamin (MULTIVITAMIN WITH MINERALS) TABS tablet Take 1 tablet by mouth  daily.   nitroGLYCERIN (NITROSTAT) 0.4 MG SL tablet Place 1 tablet (0.4 mg total) under the tongue every 5 (five) minutes as needed for chest pain.   Omega-3 Fatty Acids (FISH OIL) 1000 MG CPDR Take 1 capsule by mouth daily.   sildenafil (REVATIO) 20 MG tablet Take 1-3 tablets (20-60 mg total) by mouth as needed.   No facility-administered encounter medications on file as of 05/03/2022.    Allergies (verified) Patient has no known allergies.   History: Past Medical History:  Diagnosis Date   [place]otid artery disease ([place]    Carotid Dopplers December 2014 with 1-39% bilateral ICA stenoses   Coronary artery disease    DES to LAD January 2016   Elevated PSA    Biopsies negative 2009    GERD (gastroesophageal reflux disease)    Hyperlipidemia    Mel[place] of [place] (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    Past Surgical History:  Procedure Laterality Date   LEFT HEART CATHETERIZATION WITH CORONARY ANGIOGRAM N/A 04/12/2014   Procedure: LEFT HEART CATHETERIZATION WITH CORONARY ANGIOGRAM;  Surgeon: JJettie Booze MD;  [facility];  Service: Cardiovascular;  Laterality: N/A;   MELANOMA EXCISION Right 2000's   cheek   Family History  Problem Relation Age of Onset  Hypertension Father    CAD Father        Premature disease   AAA (abdominal aortic aneurysm) Father        Died in his 47s   Social History   Socioeconomic History   Marital status: Married    Spouse name: Jeani Hawking   Number of children: 2   Years of education: Not on file   Highest education level: Not on file  Occupational History   Occupation: remodeling homes  Tobacco Use   Smoking status: Never   Smokeless tobacco: Never  Vaping Use   Vaping Use: Never used  Substance and Sexual Activity   Alcohol use: No    Alcohol/week: 0.0 standard drinks of alcohol   Drug use: No   Sexual activity: Yes  Other Topics Concern   Not on  file  Social History Narrative   Not on file   Social Determinants of Health   Financial Resource Strain: Low Risk  (05/03/2022)   Overall Financial Resource Strain (CARDIA)    Difficulty of Paying Living Expenses: Not hard at all  Food Insecurity: No Food Insecurity (05/03/2022)   Hunger Vital Sign    Worried About Running Out of Food in the Last Year: Never true    [place] in the Last Year: Never true  Transportation Needs: No Transportation Needs (05/03/2022)   PRAPARE - Hydrologist (Medical): No    Lack of Transportation (Non-Medical): No  Physical Activity: Sufficiently Active (05/03/2022)   Exercise Vital Sign    Days of Exercise per Week: 5 days    Minutes of Exercise per Session: 60 min  Stress: No Stress Concern Present (05/03/2022)   [place]    Feeling of Stress : Not at all  Social Connections: [place] (05/03/2022)   Social Connection and Isolation Panel [NHANES]    Frequency of Communication with Friends and Family: More than three times a week    Frequency of Social Gatherings with Friends and Family: Three times a week    Attends Religious Services: More than 4 times per year    Active Member of Clubs or Organizations: Yes    Attends Music therapist: More than 4 times per year    Marital Status: Married    Tobacco Counseling Counseling given: Not Answered   Clinical Intake:  Pre-visit preparation completed: Yes  Pain : No/denies pain  Diabetes: No  How often do you need to have someone help you when you read instructions, pamphlets, or other written materials from your doctor or pharmacy?: 1 - Never  Diabetic?No   Interpreter Needed?: No  Information entered by :: Denman George LPN   Activities of Daily Living    05/03/2022    5:07 PM  In your present state of health, do you have any difficulty performing the following  activities:  Hearing? 0  Vision? 0  Difficulty concentrating or making decisions? 0  Walking or climbing stairs? 0  Dressing or bathing? 0  Doing errands, shopping? 0  Preparing Food and eating ? N  Using the Toilet? N  In the past six months, have you accidently leaked urine? N  Do you have problems with loss of bowel control? N  Managing your Medications? N  Managing your Finances? N  Housekeeping or managing your Housekeeping? N    Patient Care Team: Dettinger, Fransisca Kaufmann, MD as PCP - General (Family Medicine) Domenic Polite,  Aloha Gell, MD as PCP - Cardiology (Cardiology) Satira Sark, MD as Consulting Physician (Cardiology)  Indicate any recent Medical Services you may have received from other than [facility] providers in the past year (date may be approximate).     Assessment:   This is a routine wellness examination for Charle.  Hearing/Vision screen Hearing Screening - Comments:: Denies hearing difficulties  Vision Screening - Comments::  up to date with routine eye exams with Dr. Katy Fitch   Dietary issues and exercise activities discussed: Current Exercise Habits: Home exercise routine, Time (Minutes): 30, Frequency (Times/Week): 5, Weekly Exercise (Minutes/Week): 150, Intensity: Mild   Goals Addressed   None    Depression Screen    05/03/2022    2:44 PM 03/11/2022    8:13 AM 09/07/2021    8:00 AM 03/09/2021    9:54 AM 02/17/2021    5:00 PM 03/06/2020   10:05 AM 08/10/2019    9:26 AM  PHQ 2/9 Scores  PHQ - 2 Score 0 0 0 0 0 0 0  PHQ- 9 Score  1         Fall Risk    05/03/2022    2:41 PM 03/11/2022    8:13 AM 09/07/2021    8:00 AM 02/17/2021    5:01 PM 03/06/2020   10:05 AM  [place] in the past year? 0 0 0 0 0  Number falls in past yr: 0   0   Injury with Fall? 0   0   Risk for fall due to :    No Fall Risks   Follow up Falls prevention discussed;Education provided;Falls evaluation completed   Falls prevention discussed     FALL RISK PREVENTION  PERTAINING TO THE HOME:  Any stairs in or around the home? Yes  If so, are there any without handrails? No  Home free of loose throw rugs in walkways, pet beds, electrical cords, etc? Yes  Adequate lighting in your home to reduce risk of falls? Yes   ASSISTIVE DEVICES UTILIZED TO PREVENT FALLS:  Life alert? No  Use of a cane, walker or w/c? No  Grab bars in the bathroom? Yes  Shower chair or bench in shower? No  Elevated toilet seat or a handicapped toilet? Yes   TIMED UP AND GO:  Was the test performed? No . Telephonic visit   Cognitive Function:        05/03/2022    5:08 PM 02/17/2021    5:04 PM  6CIT Screen  What Year? 0 points 0 points  What month? 0 points 0 points  What time? 0 points 0 points  Count back from 20 0 points 0 points  Months in reverse 0 points 0 points  Repeat phrase 0 points 0 points  Total Score 0 points 0 points    Immunizations Immunization History  Administered Date(s) Administered   Fluad Quad(high Dose 65+) 01/26/2019, 03/06/2020, 03/09/2021, 03/11/2022   Influenza Split 12/27/2013   Influenza, High Dose Seasonal PF 12/27/2015, 04/20/2018   Influenza, Seasonal, Injecte, Preservative Fre 11/27/2013   Influenza-Unspecified 11/27/2013, 12/27/2015, 02/10/2017   Moderna Sars-Covid-2 Vaccination 05/24/2019, 06/22/2019   Pneumococcal Conjugate-13 02/27/2015   Pneumococcal Polysaccharide-23 03/29/2009, 08/06/2016   Td 03/29/2006   Tdap 01/26/2019   Zoster Recombinat (Shingrix) 03/06/2020, 09/09/2020    TDAP status: Up to date  Flu Vaccine status: Up to date  Pneumococcal vaccine status: Up to date  Covid-19 vaccine status: Information provided on how to obtain vaccines.  Qualifies for Shingles Vaccine? Yes   Zostavax completed No   Shingrix Completed?: Yes  Screening Tests Health Maintenance  Topic Date Due   COVID-19 Vaccine (3 - Moderna risk series) 07/20/2019   Medicare Annual Wellness (AWV)  05/04/2023   COLONOSCOPY (Pts  45-8yr Insurance coverage will need to be confirmed)  01/26/2027   DTaP/Tdap/Td (3 - Td or Tdap) 01/25/2029   Pneumonia Vaccine 72 Years old  Completed   INFLUENZA VACCINE  Completed   Zoster Vaccines- Shingrix  Completed   HPV VACCINES  Aged Out   Hepatitis C Screening  Discontinued    Health Maintenance  Health Maintenance Due  Topic Date Due   COVID-19 Vaccine (3 - Moderna risk series) 07/20/2019    Colorectal cancer screening: Type of screening: Colonoscopy. Completed 01/25/17. Repeat every 10 years  Lung Cancer Screening: (Low Dose CT Chest recommended if Age 33110-80years, 30 pack-year currently smoking OR have quit w/in 15years.) does not qualify.   Lung Cancer Screening Referral: n/a   Additional Screening:  Hepatitis C Screening: does not qualify  Vision Screening: Recommended annual ophthalmology exams for early detection of glaucoma and other disorders of the eye. Is the patient up to date with their annual eye exam?  Yes  Who is the provider or what is the name of the office in which the patient attends annual eye exams? Dr. GKaty Fitch If pt is not established with a provider, would they like to be referred to a provider to establish care? No .   Dental Screening: Recommended annual dental exams for proper oral hygiene  Community Resource Referral / Chronic Care Management: CRR required this visit?  No   CCM required this visit?  No      Plan:     I have personally reviewed and noted the following in the patient's chart:   Medical and social history Use of alcohol, tobacco or illicit drugs  Current medications and supplements including opioid prescriptions. Patient is not currently taking opioid prescriptions. Functional ability and status Nutritional status Physical activity Advanced directives List of other physicians Hospitalizations, surgeries, and ER visits in previous 12 months Vitals Screenings to include cognitive, depression, and  falls Referrals and appointments  In addition, I have reviewed and discussed with patient certain preventive protocols, quality metrics, and best practice recommendations. A written personalized care plan for preventive services as well as general preventive health recommendations were provided to patient.     SVanetta Mulders [place]  [phone]  Due to this being a virtual visit, the after visit summary with patients personalized plan was offered to patient via mail or my-chart. Patient would like to access on my-chart  Nurse Notes: No concerns

## 2022-05-03 NOTE — Patient Instructions (Signed)
Jesse Brady , Thank you for taking time to come for your Medicare Wellness Visit. I appreciate your ongoing commitment to your health goals. Please review the following plan we discussed and let me know if I can assist you in the future.   These are the goals we discussed:  Goals      Patient Stated     Stay healthy and active        This is a list of the screening recommended for you and due dates:  Health Maintenance  Topic Date Due   COVID-19 Vaccine (3 - Moderna risk series) 07/20/2019   Medicare Annual Wellness Visit  05/04/2023   Colon Cancer Screening  01/26/2027   DTaP/Tdap/Td vaccine (3 - Td or Tdap) 01/25/2029   Pneumonia Vaccine  Completed   Flu Shot  Completed   Zoster (Shingles) Vaccine  Completed   HPV Vaccine  Aged Out   Hepatitis C Screening: USPSTF Recommendation to screen - Ages 73-79 yo.  Discontinued    Advanced directives: Please bring a copy of your health care power of attorney and living will to the office to be added to your chart at your convenience.   Conditions/risks identified: Aim for 30 minutes of exercise or brisk walking, 6-8 glasses of water, and 5 servings of fruits and vegetables each day.   Next appointment: Follow up in one year for your annual wellness visit.   Preventive Care 79 Years and Older, Male  Preventive care refers to lifestyle choices and visits with your health care provider that can promote health and wellness. What does preventive care include? A yearly physical exam. This is also called an annual well check. Dental exams once or twice a year. Routine eye exams. Ask your health care provider how often you should have your eyes checked. Personal lifestyle choices, including: Daily care of your teeth and gums. Regular physical activity. Eating a healthy diet. Avoiding tobacco and drug use. Limiting alcohol use. Practicing safe sex. Taking low doses of aspirin every day. Taking vitamin and mineral supplements as  recommended by your health care provider. What happens during an annual well check? The services and screenings done by your health care provider during your annual well check will depend on your age, overall health, lifestyle risk factors, and family history of disease. Counseling  Your health care provider may ask you questions about your: Alcohol use. Tobacco use. Drug use. Emotional well-being. Home and relationship well-being. Sexual activity. Eating habits. History of falls. Memory and ability to understand (cognition). Work and work Statistician. Screening  You may have the following tests or measurements: Height, weight, and BMI. Blood pressure. Lipid and cholesterol levels. These may be checked every 5 years, or more frequently if you are over 44 years old. Skin check. Lung cancer screening. You may have this screening every year starting at age 74 if you have a 30-pack-year history of smoking and currently smoke or have quit within the past 15 years. Fecal occult blood test (FOBT) of the stool. You may have this test every year starting at age 87. Flexible sigmoidoscopy or colonoscopy. You may have a sigmoidoscopy every 5 years or a colonoscopy every 10 years starting at age 58. Prostate cancer screening. Recommendations will vary depending on your family history and other risks. Hepatitis C blood test. Hepatitis B blood test. Sexually transmitted disease (STD) testing. Diabetes screening. This is done by checking your blood sugar (glucose) after you have not eaten for a while (fasting). You may  have this done every 1-3 years. Abdominal aortic aneurysm (AAA) screening. You may need this if you are a current or former smoker. Osteoporosis. You may be screened starting at age 45 if you are at high risk. Talk with your health care provider about your test results, treatment options, and if necessary, the need for more tests. Vaccines  Your health care provider may recommend  certain vaccines, such as: Influenza vaccine. This is recommended every year. Tetanus, diphtheria, and acellular pertussis (Tdap, Td) vaccine. You may need a Td booster every 10 years. Zoster vaccine. You may need this after age 40. Pneumococcal 13-valent conjugate (PCV13) vaccine. One dose is recommended after age 83. Pneumococcal polysaccharide (PPSV23) vaccine. One dose is recommended after age 44. Talk to your health care provider about which screenings and vaccines you need and how often you need them. This information is not intended to replace advice given to you by your health care provider. Make sure you discuss any questions you have with your health care provider. Document Released: 04/11/2015 Document Revised: 12/03/2015 Document Reviewed: 01/14/2015 Elsevier Interactive Patient Education  2017 [employer] [employer] Prevention in the Home Falls can cause injuries. They can happen to people of all ages. There are many things you can do to make your home safe and to help prevent falls. What can I do on the outside of my home? Regularly fix the edges of walkways and driveways and fix any cracks. Remove anything that might make you trip as you walk through a door, such as a raised step or threshold. Trim any bushes or trees on the path to your home. Use bright outdoor lighting. Clear any walking paths of anything that might make someone trip, such as rocks or tools. Regularly check to see if handrails are loose or broken. Make sure that both sides of any steps have handrails. Any raised decks and porches should have guardrails on the edges. Have any leaves, snow, or ice cleared regularly. Use sand or salt on walking paths during winter. Clean up any spills in your garage right away. This includes oil or grease spills. What can I do in the bathroom? Use night lights. Install grab bars by the toilet and in the tub and shower. Do not use towel bars as grab bars. Use non-skid mats or  decals in the tub or shower. If you need to sit down in the shower, use a plastic, non-slip stool. Keep the floor dry. Clean up any water that spills on the floor as soon as it happens. Remove soap buildup in the tub or shower regularly. Attach bath mats securely with double-sided non-slip rug tape. Do not have throw rugs and other things on the floor that can make you trip. What can I do in the bedroom? Use night lights. Make sure that you have a light by your bed that is easy to reach. Do not use any sheets or blankets that are too big for your bed. They should not hang down onto the floor. Have a firm chair that has side arms. You can use this for support while you get dressed. Do not have throw rugs and other things on the floor that can make you trip. What can I do in the kitchen? Clean up any spills right away. Avoid walking on wet floors. Keep items that you use a lot in easy-to-reach places. If you need to reach something above you, use a strong step stool that has a grab bar. Keep electrical cords  out of the way. Do not use floor polish or wax that makes floors slippery. If you must use wax, use non-skid floor wax. Do not have throw rugs and other things on the floor that can make you trip. What can I do with my stairs? Do not leave any items on the stairs. Make sure that there are handrails on both sides of the stairs and use them. Fix handrails that are broken or loose. Make sure that handrails are as long as the stairways. Check any carpeting to make sure that it is firmly attached to the stairs. Fix any carpet that is loose or worn. Avoid having throw rugs at the top or bottom of the stairs. If you do have throw rugs, attach them to the floor with carpet tape. Make sure that you have a light switch at the top of the stairs and the bottom of the stairs. If you do not have them, ask someone to add them for you. What else can I do to help prevent falls? Wear shoes that: Do not  have high heels. Have rubber bottoms. Are comfortable and fit you well. Are closed at the toe. Do not wear sandals. If you use a stepladder: Make sure that it is fully opened. Do not climb a closed stepladder. Make sure that both sides of the stepladder are locked into place. Ask someone to hold it for you, if possible. Clearly mark and make sure that you can see: Any grab bars or handrails. First and last steps. Where the edge of each step is. Use tools that help you move around (mobility aids) if they are needed. These include: Canes. Walkers. Scooters. Crutches. Turn on the lights when you go into a dark area. Replace any light bulbs as soon as they burn out. Set up your furniture so you have a clear path. Avoid moving your furniture around. If any of your floors are uneven, fix them. If there are any pets around you, be aware of where they are. Review your medicines with your doctor. Some medicines can make you feel dizzy. This can increase your chance of falling. Ask your doctor what other things that you can do to help prevent falls. This information is not intended to replace advice given to you by your health care provider. Make sure you discuss any questions you have with your health care provider. Document Released: 01/09/2009 Document Revised: 08/21/2015 Document Reviewed: 04/19/2014 Elsevier Interactive Patient Education  2017 [employer].

## 2022-05-06 ENCOUNTER — Ambulatory Visit: Payer: Medicare Other | Admitting: Cardiology

## 2022-05-26 NOTE — Progress Notes (Unsigned)
    Cardiology Office Note  Date: 05/27/2022   ID: Jesse Brady, DOB 07/05/1950, MRN [id number]  History of Present Illness: Jesse Brady is a 72 y.o. male last seen in July 2023.  He is here for a routine visit.  Reports no angina or nitroglycerin use.  No change in stamina, NYHA class I-II dyspnea, no palpitations or syncope.  Still working part-time to stay busy.  We went over his stress test results from July 2023, overall reassuring.  He also had interval lab work showing excellent LDL control at 52 on Lipitor and Zetia.  I reviewed the remainder of his medications and he reports compliance with therapy.  Physical Exam: VS:  BP 118/70   Pulse 66   Ht 5' 9"$  (1.753 m)   Wt 176 lb 12.8 oz (80.2 kg)   SpO2 98%   BMI 26.11 kg/m , BMI Body mass index is 26.11 kg/m.  Wt Readings from Last 3 Encounters:  05/27/22 176 lb 12.8 oz (80.2 kg)  05/03/22 173 lb (78.5 kg)  03/11/22 173 lb (78.5 kg)    General: Patient appears comfortable at rest. HEENT: Conjunctiva and lids normal. Neck: Supple, no elevated JVP or carotid bruits. Lungs: Clear to auscultation, nonlabored breathing at rest. Cardiac: Regular rate and rhythm, no S3 or significant systolic murmur.  ECG:  An ECG dated 09/30/2021 was personally reviewed today and demonstrated:  Sinus rhythm with increased voltage.  Labwork: 03/09/2022: ALT 27; AST 25; BUN 22; Creatinine, Ser 0.93; Hemoglobin 15.6; Platelets 200; Potassium 5.3; Sodium 141     Component Value Date/Time   CHOL 110 01/04/2022 0934   TRIG 53 01/04/2022 0934   HDL 46 01/04/2022 0934   CHOLHDL 2.4 01/04/2022 0934   CHOLHDL 6.1 03/12/2013 2327   VLDL 59 (H) 03/12/2013 2327   LDLCALC 52 01/04/2022 0934   Other Studies Reviewed Today:  Exercise Myoview 10/12/2021:   The study is normal. The study is low risk.   No ST deviation was noted.   Left ventricular function is normal. End diastolic cavity size is normal.   Moderate defect with mild intensity in  the inferior wall seen at rest and stress; without wall motion abnormalities suspect this is gut attenuation artifact.  No changes from prior study 07/27/2017  Assessment and Plan:  1.  CAD status post DES to the LAD in January 2016.  Exercise Myoview from July 2023 showed no evidence of ischemia and normal LVEF.  He continues to do well without active angina or nitroglycerin use.  Continue observation on aspirin, Lipitor, and Zetia.  2.  Mixed hyperlipidemia, most recently on Lipitor and Zetia.  LDL 52 in October 2023.  No changes were made today.  Disposition:  Follow up  1 year.  Signed, Satira Sark, M.D., F.A.C.C.

## 2022-05-27 ENCOUNTER — Encounter: Payer: Self-pay | Admitting: Cardiology

## 2022-05-27 ENCOUNTER — Ambulatory Visit: Payer: Medicare Other | Attending: Cardiology | Admitting: Cardiology

## 2022-05-27 VITALS — BP 118/70 | HR 66 | Ht 69.0 in | Wt 176.8 lb

## 2022-05-27 DIAGNOSIS — I25119 Atherosclerotic heart disease of native coronary artery with unspecified angina pectoris: Secondary | ICD-10-CM

## 2022-05-27 DIAGNOSIS — E782 Mixed hyperlipidemia: Secondary | ICD-10-CM | POA: Diagnosis not present

## 2022-05-27 NOTE — Patient Instructions (Addendum)
Medication Instructions:  Your physician recommends that you continue on your current medications as directed. Please refer to the Current Medication list given to you today.  Labwork: none  Testing/Procedures: none  Follow-Up: Your physician recommends that you schedule a follow-up appointment in: 1 year. You will receive a reminder call in the mail in about 10 months reminding you to call and schedule your appointment. If you don't receive this call, please contact our office.  Any Other Special Instructions Will Be Listed Below (If Applicable).  If you need a refill on your cardiac medications before your next appointment, please call your pharmacy. 

## 2022-06-03 ENCOUNTER — Other Ambulatory Visit: Payer: Self-pay | Admitting: Family Medicine

## 2022-06-03 DIAGNOSIS — Z8546 Personal history of malignant neoplasm of prostate: Secondary | ICD-10-CM

## 2022-08-12 ENCOUNTER — Other Ambulatory Visit: Payer: Self-pay | Admitting: Family Medicine

## 2022-08-12 DIAGNOSIS — Z8546 Personal history of malignant neoplasm of prostate: Secondary | ICD-10-CM

## 2022-09-03 ENCOUNTER — Other Ambulatory Visit: Payer: Self-pay | Admitting: Cardiology

## 2022-09-03 ENCOUNTER — Other Ambulatory Visit: Payer: Self-pay | Admitting: Family Medicine

## 2022-09-03 DIAGNOSIS — E782 Mixed hyperlipidemia: Secondary | ICD-10-CM

## 2022-09-10 ENCOUNTER — Ambulatory Visit: Payer: Medicare Other | Admitting: Family Medicine

## 2022-10-21 ENCOUNTER — Other Ambulatory Visit: Payer: Self-pay | Admitting: Family Medicine

## 2022-10-21 DIAGNOSIS — Z8546 Personal history of malignant neoplasm of prostate: Secondary | ICD-10-CM

## 2022-10-29 ENCOUNTER — Ambulatory Visit: Payer: Medicare Other | Admitting: Family Medicine

## 2022-11-12 ENCOUNTER — Other Ambulatory Visit: Payer: Self-pay | Admitting: Family Medicine

## 2022-11-12 DIAGNOSIS — E782 Mixed hyperlipidemia: Secondary | ICD-10-CM

## 2022-11-17 ENCOUNTER — Telehealth: Payer: Self-pay | Admitting: Family Medicine

## 2022-11-17 DIAGNOSIS — E782 Mixed hyperlipidemia: Secondary | ICD-10-CM

## 2022-11-17 NOTE — Telephone Encounter (Signed)
Please add future lab order. Pt is scheduled to come in tomorrow morning for lab appt.

## 2022-11-17 NOTE — Telephone Encounter (Signed)
Labs placed.

## 2022-11-18 ENCOUNTER — Other Ambulatory Visit: Payer: Medicare Other

## 2022-11-18 DIAGNOSIS — E782 Mixed hyperlipidemia: Secondary | ICD-10-CM

## 2022-11-18 LAB — CMP14+EGFR
ALT: 25 IU/L (ref 0–44)
AST: 23 IU/L (ref 0–40)
Albumin: 4.3 g/dL (ref 3.8–4.8)
Alkaline Phosphatase: 79 IU/L (ref 44–121)
BUN/Creatinine Ratio: 17 (ref 10–24)
BUN: 19 mg/dL (ref 8–27)
Bilirubin Total: 0.8 mg/dL (ref 0.0–1.2)
CO2: 25 mmol/L (ref 20–29)
Calcium: 9.6 mg/dL (ref 8.6–10.2)
Chloride: 104 mmol/L (ref 96–106)
Creatinine, Ser: 1.13 mg/dL (ref 0.76–1.27)
Globulin, Total: 2.3 g/dL (ref 1.5–4.5)
Glucose: 103 mg/dL — ABNORMAL HIGH (ref 70–99)
Potassium: 5.1 mmol/L (ref 3.5–5.2)
Sodium: 141 mmol/L (ref 134–144)
Total Protein: 6.6 g/dL (ref 6.0–8.5)
eGFR: 68 mL/min/{1.73_m2} (ref >59)

## 2022-11-18 LAB — CBC WITH DIFFERENTIAL/PLATELET
Basophils Absolute: 0 10*3/uL (ref 0.0–0.2)
Basos: 1 %
EOS (ABSOLUTE): 0.2 10*3/uL (ref 0.0–0.4)
Eos: 3 %
Hematocrit: 45.5 % (ref 37.5–51.0)
Hemoglobin: 15.7 g/dL (ref 13.0–17.7)
Immature Grans (Abs): 0 10*3/uL (ref 0.0–0.1)
Immature Granulocytes: 0 %
Lymphocytes Absolute: 1.2 10*3/uL (ref 0.7–3.1)
Lymphs: 20 %
MCH: 31.4 pg (ref 26.6–33.0)
MCHC: 34.5 g/dL (ref 31.5–35.7)
MCV: 91 fL (ref 79–97)
Monocytes Absolute: 0.6 10*3/uL (ref 0.1–0.9)
Monocytes: 9 %
Neutrophils Absolute: 3.9 10*3/uL (ref 1.4–7.0)
Neutrophils: 67 %
Platelets: 210 10*3/uL (ref 150–450)
RBC: 5 x10E6/uL (ref 4.14–5.80)
RDW: 12.5 % (ref 11.6–15.4)
WBC: 5.9 10*3/uL (ref 3.4–10.8)

## 2022-11-18 LAB — LIPID PANEL
Chol/HDL Ratio: 2.3 ratio (ref 0.0–5.0)
Cholesterol, Total: 107 mg/dL (ref 100–199)
HDL: 46 mg/dL (ref >39)
LDL Chol Calc (NIH): 49 mg/dL (ref 0–99)
Triglycerides: 51 mg/dL (ref 0–149)
VLDL Cholesterol Cal: 12 mg/dL (ref 5–40)

## 2022-11-24 ENCOUNTER — Ambulatory Visit (INDEPENDENT_AMBULATORY_CARE_PROVIDER_SITE_OTHER): Payer: Medicare Other | Admitting: Family Medicine

## 2022-11-24 ENCOUNTER — Encounter: Payer: Self-pay | Admitting: Family Medicine

## 2022-11-24 VITALS — BP 117/74 | HR 64 | Ht 69.0 in | Wt 171.0 lb

## 2022-11-24 DIAGNOSIS — E782 Mixed hyperlipidemia: Secondary | ICD-10-CM

## 2022-11-24 DIAGNOSIS — I6523 Occlusion and stenosis of bilateral carotid arteries: Secondary | ICD-10-CM

## 2022-11-24 DIAGNOSIS — N400 Enlarged prostate without lower urinary tract symptoms: Secondary | ICD-10-CM | POA: Diagnosis not present

## 2022-11-24 DIAGNOSIS — Z8546 Personal history of malignant neoplasm of prostate: Secondary | ICD-10-CM | POA: Diagnosis not present

## 2022-11-24 MED ORDER — FINASTERIDE 5 MG PO TABS: 5.0000 mg | ORAL_TABLET | Freq: Every day | ORAL | 3 refills | Status: DC

## 2022-11-24 MED ORDER — EZETIMIBE 10 MG PO TABS: 10.0000 mg | ORAL_TABLET | Freq: Every day | ORAL | 3 refills | Status: DC

## 2022-11-24 NOTE — Progress Notes (Signed)
BP 117/74   Pulse 64   Ht 5\' 9"  (1.753 m)   Wt 171 lb (77.6 kg)   SpO2 96%   BMI 25.25 kg/m    Subjective:   Patient ID: Jesse Brady, male    DOB: Jul 23, 1950, 72 y.o.   MRN: [id number]  HPI: Jesse Brady is a 72 y.o. male presenting on 11/24/2022 for Medical Management of Chronic Issues and Hyperlipidemia   HPI Hyperlipidemia and CAD recheck Patient is coming in for recheck of his hyperlipidemia. The patient is currently taking atorvastatin and aspirin and fish oils and Zetia. They deny any issues with myalgias or history of liver damage from it. They deny any focal numbness or weakness or chest pain.   Prostate enlarged with history of prostate cancer Patient is coming in for recheck on BPH Symptoms: Some urinary stream decreased Medication: Finasteride and sildenafil as needed for ED Last PSA: 6 months ago at 1.0  Relevant past medical, surgical, family and social history reviewed and updated as indicated. Interim medical history since our last visit reviewed. Allergies and medications reviewed and updated.  Review of Systems  Constitutional:  Negative for chills and fever.  Eyes:  Negative for visual disturbance.  Respiratory:  Negative for shortness of breath and wheezing.   Cardiovascular:  Negative for chest pain and leg swelling.  Musculoskeletal:  Negative for back pain and gait problem.  Skin:  Negative for rash.  Neurological:  Negative for dizziness, weakness and light-headedness.  All other systems reviewed and are negative.   Per HPI unless specifically indicated above   Allergies as of 11/24/2022   No Known Allergies      Medication List        Accurate as of November 24, 2022  4:44 PM. If you have any questions, ask your nurse or doctor.          aspirin EC 81 MG tablet Take 81 mg by mouth daily.   atorvastatin 40 MG tablet Commonly known as: LIPITOR TAKE 1 TABLET BY MOUTH DAILY   co-enzyme Q-10 30 MG capsule Take 30 mg by mouth  daily.   ezetimibe 10 MG tablet Commonly known as: ZETIA Take 1 tablet (10 mg total) by mouth daily.   finasteride 5 MG tablet Commonly known as: PROSCAR Take 1 tablet (5 mg total) by mouth daily.   Fish Oil 1000 MG Cpdr Take 1 capsule by mouth daily.   metroNIDAZOLE 1 % gel Commonly known as: Metrogel Apply topically daily.   multivitamin with minerals Tabs tablet Take 1 tablet by mouth daily.   nitroGLYCERIN 0.4 MG SL tablet Commonly known as: Nitrostat Place 1 tablet (0.4 mg total) under the tongue every 5 (five) minutes as needed for chest pain.   sildenafil 20 MG tablet Commonly known as: REVATIO Take 1-3 tablets (20-60 mg total) by mouth as needed.         Objective:   BP 117/74   Pulse 64   Ht 5\' 9"  (1.753 m)   Wt 171 lb (77.6 kg)   SpO2 96%   BMI 25.25 kg/m   Wt Readings from Last 3 Encounters:  11/24/22 171 lb (77.6 kg)  05/27/22 176 lb 12.8 oz (80.2 kg)  05/03/22 173 lb (78.5 kg)    Physical Exam Vitals and nursing note reviewed.  Constitutional:      General: He is not in acute distress.    Appearance: He is well-developed. He is not diaphoretic.  Eyes:  General: No scleral icterus.    Conjunctiva/sclera: Conjunctivae normal.  Neck:     Thyroid: No thyromegaly.  Cardiovascular:     Rate and Rhythm: Normal rate and regular rhythm.     Heart sounds: Normal heart sounds. No murmur heard. Pulmonary:     Effort: Pulmonary effort is normal. No respiratory distress.     Breath sounds: Normal breath sounds. No wheezing.  Musculoskeletal:        General: No swelling. Normal range of motion.     Cervical back: Neck supple.  Lymphadenopathy:     Cervical: No cervical adenopathy.  Skin:    General: Skin is warm and dry.     Findings: No rash.  Neurological:     Mental Status: He is alert and oriented to person, place, and time.     Coordination: Coordination normal.  Psychiatric:        Behavior: Behavior normal.     Results for orders  placed or performed in visit on 11/18/22  Lipid panel  Result Value Ref Range   Cholesterol, Total 107 100 - 199 mg/dL   Triglycerides 51 0 - 149 mg/dL   HDL 46 >82 mg/dL   VLDL Cholesterol Cal 12 5 - 40 mg/dL   LDL Chol Calc (NIH) 49 0 - 99 mg/dL   Chol/HDL Ratio 2.3 0.0 - 5.0 ratio  CMP14+EGFR  Result Value Ref Range   Glucose 103 (H) 70 - 99 mg/dL   BUN 19 8 - 27 mg/dL   Creatinine, Ser 9.56 0.76 - 1.27 mg/dL   eGFR 68 >21 HY/QMV/7.84   BUN/Creatinine Ratio 17 10 - 24   Sodium 141 134 - 144 mmol/L   Potassium 5.1 3.5 - 5.2 mmol/L   Chloride 104 96 - 106 mmol/L   CO2 25 20 - 29 mmol/L   Calcium 9.6 8.6 - 10.2 mg/dL   Total Protein 6.6 6.0 - 8.5 g/dL   Albumin 4.3 3.8 - 4.8 g/dL   Globulin, Total 2.3 1.5 - 4.5 g/dL   Bilirubin Total 0.8 0.0 - 1.2 mg/dL   Alkaline Phosphatase 79 44 - 121 IU/L   AST 23 0 - 40 IU/L   ALT 25 0 - 44 IU/L  CBC with Differential/Platelet  Result Value Ref Range   WBC 5.9 3.4 - 10.8 x10E3/uL   RBC 5.00 4.14 - 5.80 x10E6/uL   Hemoglobin 15.7 13.0 - 17.7 g/dL   Hematocrit 69.6 29.5 - 51.0 %   MCV 91 79 - 97 fL   MCH 31.4 26.6 - 33.0 pg   MCHC 34.5 31.5 - 35.7 g/dL   RDW 28.4 13.2 - 44.0 %   Platelets 210 150 - 450 x10E3/uL   Neutrophils 67 Not Estab. %   Lymphs 20 Not Estab. %   Monocytes 9 Not Estab. %   Eos 3 Not Estab. %   Basos 1 Not Estab. %   Neutrophils Absolute 3.9 1.4 - 7.0 x10E3/uL   Lymphocytes Absolute 1.2 0.7 - 3.1 x10E3/uL   Monocytes Absolute 0.6 0.1 - 0.9 x10E3/uL   EOS (ABSOLUTE) 0.2 0.0 - 0.4 x10E3/uL   Basophils Absolute 0.0 0.0 - 0.2 x10E3/uL   Immature Granulocytes 0 Not Estab. %   Immature Grans (Abs) 0.0 0.0 - 0.1 x10E3/uL    Assessment & Plan:   Problem List Items Addressed This Visit       Cardiovascular and Mediastinum   Carotid artery disease (HCC)   Relevant Medications   ezetimibe (ZETIA) 10 MG tablet  Other   Hyperlipidemia - Primary   Relevant Medications   ezetimibe (ZETIA) 10 MG tablet    Prostate hypertrophy   History of prostate cancer   Relevant Medications   finasteride (PROSCAR) 5 MG tablet    Patient seems to be doing well and blood work looks really good except for the blood sugar that was borderline.  He is going to focus on diet for the last and has already looked up some things.  He sees his cardiologist still and has been checking out good there.  No changes today Follow up plan: Return in about 6 months (around 05/27/2023), or if symptoms worsen or fail to improve, for Physical exam and hyperlipidemia.  Counseling provided for all of the vaccine components No orders of the defined types were placed in this encounter.   Arville Care, MD [facility] 11/24/2022, 4:44 PM

## 2022-12-22 DIAGNOSIS — Z85820 Personal history of malignant melanoma of skin: Secondary | ICD-10-CM | POA: Diagnosis not present

## 2022-12-22 DIAGNOSIS — L812 Freckles: Secondary | ICD-10-CM | POA: Diagnosis not present

## 2022-12-22 DIAGNOSIS — Z85828 Personal history of other malignant neoplasm of skin: Secondary | ICD-10-CM | POA: Diagnosis not present

## 2022-12-22 DIAGNOSIS — D1801 Hemangioma of skin and subcutaneous tissue: Secondary | ICD-10-CM | POA: Diagnosis not present

## 2022-12-22 DIAGNOSIS — L821 Other seborrheic keratosis: Secondary | ICD-10-CM | POA: Diagnosis not present

## 2022-12-30 ENCOUNTER — Other Ambulatory Visit: Payer: Self-pay | Admitting: Family Medicine

## 2022-12-30 DIAGNOSIS — Z8546 Personal history of malignant neoplasm of prostate: Secondary | ICD-10-CM

## 2023-01-10 DIAGNOSIS — H4311 Vitreous hemorrhage, right eye: Secondary | ICD-10-CM | POA: Diagnosis not present

## 2023-01-10 DIAGNOSIS — H2513 Age-related nuclear cataract, bilateral: Secondary | ICD-10-CM | POA: Diagnosis not present

## 2023-01-10 DIAGNOSIS — H43813 Vitreous degeneration, bilateral: Secondary | ICD-10-CM | POA: Diagnosis not present

## 2023-01-21 ENCOUNTER — Other Ambulatory Visit: Payer: Self-pay | Admitting: Family Medicine

## 2023-01-21 DIAGNOSIS — E782 Mixed hyperlipidemia: Secondary | ICD-10-CM

## 2023-02-09 ENCOUNTER — Other Ambulatory Visit: Payer: Self-pay | Admitting: Cardiology

## 2023-05-10 ENCOUNTER — Ambulatory Visit (INDEPENDENT_AMBULATORY_CARE_PROVIDER_SITE_OTHER): Payer: Medicare Other

## 2023-05-10 VITALS — Ht 69.0 in | Wt 171.0 lb

## 2023-05-10 DIAGNOSIS — Z0000 Encounter for general adult medical examination without abnormal findings: Secondary | ICD-10-CM

## 2023-05-10 NOTE — Patient Instructions (Signed)
Jesse Brady , Thank you for taking time to come for your Medicare Wellness Visit. I appreciate your ongoing commitment to your health goals. Please review the following plan we discussed and let me know if I can assist you in the future.   Referrals/Orders/Follow-Ups/Clinician Recommendations: Aim for 30 minutes of exercise or brisk walking, 6-8 glasses of water, and 5 servings of fruits and vegetables each day.  This is a list of the screening recommended for you and due dates:  Health Maintenance  Topic Date Due   COVID-19 Vaccine (3 - Moderna risk series) 07/20/2019   Flu Shot  10/28/2022   Medicare Annual Wellness Visit  05/09/2024   Colon Cancer Screening  01/26/2027   DTaP/Tdap/Td vaccine (3 - Td or Tdap) 01/25/2029   Pneumonia Vaccine  Completed   Zoster (Shingles) Vaccine  Completed   HPV Vaccine  Aged Out   Hepatitis C Screening  Discontinued    Advanced directives: (ACP Link)Information on Advanced Care Planning can be found at [facility] Advance Health Care Directives Advance Health Care Directives ([email or web address]   Next Medicare Annual Wellness Visit scheduled for next year: Yes

## 2023-05-10 NOTE — Progress Notes (Signed)
Subjective:   Jesse Brady is a 73 y.o. male who presents for Medicare Annual/Subsequent preventive examination.  Visit Complete: Virtual I connected with  Jesse Brady on 05/10/23 by a audio enabled telemedicine application and verified that I am speaking with the correct person using two identifiers.  Patient Location: Home  Provider Location: Home Office  This patient declined Interactive audio and video telecommunications. Therefore the visit was completed with audio only.  I discussed the limitations of evaluation and management by telemedicine. The patient expressed understanding and agreed to proceed.  Vital Signs: Because this visit was a virtual/telehealth visit, some criteria may be missing or patient reported. Any vitals not documented were not able to be obtained and vitals that have been documented are patient reported.  Cardiac Risk Factors include: advanced age (>72men, >47 women);male gender;dyslipidemia     Objective:    Today's Vitals   05/10/23 1429  Weight: 171 lb (77.6 kg)  Height: 5\' 9"  (1.753 m)   Body mass index is 25.25 kg/m.     05/10/2023    2:58 PM 05/03/2022    5:06 PM 02/17/2021    4:16 PM 04/12/2014    6:49 AM 03/12/2013    9:00 PM  Advanced Directives  Does Patient Have a Medical Advance Directive? No No;Yes Yes No Patient does not have advance directive  Type of Advance Directive  Living will Healthcare Power of [place];Living will    Does patient want to make changes to medical advance directive?  No - Patient declined     Copy of Healthcare Power of Attorney in Chart?   No - copy requested    Would patient like information on creating a medical advance directive? Yes (MAU/Ambulatory/Procedural Areas - Information given) No - Patient declined  Yes - Educational materials given     Current Medications (verified) Outpatient Encounter Medications as of 05/10/2023  Medication Sig   aspirin EC 81 MG tablet Take 81 mg by mouth daily.    atorvastatin (LIPITOR) 40 MG tablet TAKE 1 TABLET BY MOUTH DAILY   co-enzyme Q-10 30 MG capsule Take 30 mg by mouth daily.   ezetimibe (ZETIA) 10 MG tablet TAKE 1 TABLET BY MOUTH DAILY   finasteride (PROSCAR) 5 MG tablet TAKE 1 TABLET BY MOUTH DAILY   metroNIDAZOLE (METROGEL) 1 % gel Apply topically daily.   Multiple Vitamin (MULTIVITAMIN WITH MINERALS) TABS tablet Take 1 tablet by mouth daily.   nitroGLYCERIN (NITROSTAT) 0.4 MG SL tablet Place 1 tablet (0.4 mg total) under the tongue every 5 (five) minutes as needed for chest pain.   Omega-3 Fatty Acids (FISH OIL) 1000 MG CPDR Take 1 capsule by mouth daily.   sildenafil (REVATIO) 20 MG tablet Take 1-3 tablets (20-60 mg total) by mouth as needed.   No facility-administered encounter medications on file as of 05/10/2023.    Allergies (verified) Patient has no known allergies.   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 CATH LAB;  Service: Cardiovascular;  Laterality: N/A;   MELANOMA EXCISION Right 2000's   cheek   Family History  Problem Relation Age of Onset   Hypertension Father    CAD Father        Premature disease   AAA (abdominal aortic aneurysm) Father        Died in his 72s   Social History   Socioeconomic History   Marital status: Married    Spouse name: Larita Fife   Number of children: 2   Years of  education: Not on file   Highest education level: Not on file  Occupational History   Occupation: remodeling homes  Tobacco Use   Smoking status: Never   Smokeless tobacco: Never  Vaping Use   Vaping status: Never Used  Substance and Sexual Activity   Alcohol use: No    Alcohol/week: 0.0 standard drinks of alcohol   Drug use: No   Sexual activity: Yes  Other Topics Concern   Not on file  Social History Narrative   Not on file   Social Drivers of Health   Financial Resource Strain: Low Risk  (05/10/2023)   Overall Financial Resource Strain (CARDIA)    Difficulty of Paying Living Expenses: Not hard at all  Food Insecurity: No Food Insecurity (05/10/2023)   Hunger Vital Sign    Worried About Running Out of Food in the Last Year: Never true    Ran Out of Food in the Last Year: Never true  Transportation Needs: No Transportation Needs (05/10/2023)   PRAPARE - Administrator, Civil Service (Medical): No    Lack of Transportation (Non-Medical): No  Physical Activity: Sufficiently Active (05/10/2023)   Exercise Vital Sign    Days of Exercise per Week: 5 days    Minutes of Exercise per Session: 30 min  Stress: No Stress Concern Present (05/10/2023)   [employer] of Occupational Health - Occupational Stress Questionnaire    Feeling of Stress : Not at all  Social Connections: Socially Integrated (05/10/2023)   Social Connection and Isolation Panel [NHANES]    Frequency of Communication with Friends and Family: More than three times a week    Frequency of Social Gatherings with Friends and Family: Three times a week    Attends Religious Services: More than 4 times per year    Active Member of Clubs or Organizations: Yes    Attends Engineer, structural: More than 4 times per year    Marital Status: Married    Tobacco Counseling Counseling given: Not Answered   Clinical Intake:  Pre-visit preparation completed: Yes  Pain : No/denies pain      Diabetes: No  How often do you need to have someone help you when you read instructions, pamphlets, or other written materials from your doctor or pharmacy?: 1 - Never  Interpreter Needed?: No  Information entered by :: Kandis Fantasia LPN   Activities of Daily Living    05/10/2023    2:30 PM  In your present state of health, do you have any difficulty performing the following activities:  Hearing? 0  Vision? 0  Difficulty concentrating or making decisions? 0  Walking or climbing stairs? 0  Dressing or bathing? 0  Doing errands, shopping? 0  Preparing Food and eating ? N  Using the Toilet? N  In  the past six months, have you accidently leaked urine? N  Do you have problems with loss of bowel control? N  Managing your Medications? N  Managing your Finances? N  Housekeeping or managing your Housekeeping? N    Patient Care Team: Dettinger, Elige Radon, MD as PCP - General (Family Medicine) Jonelle Sidle, MD as PCP - Cardiology (Cardiology) Jonelle Sidle, MD as Consulting Physician (Cardiology)  Indicate any recent Medical Services you may have received from other than [facility] providers in the past year (date may be approximate).     Assessment:   This is a routine wellness examination for Rollyn.  Hearing/Vision screen Hearing Screening - Comments:: Denies hearing difficulties   Vision Screening - Comments:: Wears rx glasses - up to date with routine eye exams with [facility]     Goals Addressed   None   Depression Screen    05/10/2023    2:57 PM 11/24/2022    4:12 PM 05/03/2022    2:44 PM 03/11/2022    8:13 AM 09/07/2021    8:00 AM 03/09/2021    9:54 AM 02/17/2021    5:00 PM  PHQ 2/9 Scores  PHQ - 2 Score 0 0 0 0 0 0 0  PHQ- 9 Score    1       Fall Risk    05/10/2023    3:02 PM 11/24/2022    4:12 PM 05/03/2022    2:41 PM 03/11/2022    8:13 AM 09/07/2021    8:00 AM  Fall Risk   Falls in the past year? 0 0 0 0 0  Number falls in past yr: 0  0     Injury with Fall? 0  0    Risk for fall due to : No Fall Risks      Follow up Falls prevention discussed;Education provided;Falls evaluation completed  Falls prevention discussed;Education provided;Falls evaluation completed      MEDICARE RISK AT HOME: Medicare Risk at Home Any stairs in or around the home?: No If so, are there any without handrails?: No Home free of loose throw rugs in walkways, pet beds, electrical cords, etc?: Yes Adequate lighting in your home to reduce risk of falls?: Yes Life alert?: No Use of a cane, walker or w/c?: No Grab bars in the bathroom?: Yes Shower chair or bench in shower?: No Elevated toilet seat or a handicapped toilet?: Yes  TIMED UP AND GO:  Was the test performed?  No    Cognitive Function:        05/10/2023    3:02 PM 05/03/2022    5:08 PM 02/17/2021    5:04 PM  6CIT Screen  What Year? 0 points 0 points 0 points  What month? 0 points 0 points 0 points  What time? 0 points 0 points 0 points  Count back from 20 0 points 0 points 0 points  Months in reverse 0 points 0 points 0 points  Repeat phrase 0 points 0 points 0 points  Total Score 0 points 0 points 0 points    Immunizations Immunization History  Administered Date(s) Administered   Fluad Quad(high Dose 65+) 01/26/2019, 03/06/2020, 03/09/2021, 03/11/2022   Influenza Split 12/27/2013   Influenza, High Dose Seasonal PF 12/27/2015, 04/20/2018   Influenza, Seasonal, Injecte, Preservative Fre 11/27/2013   Influenza-Unspecified 11/27/2013, 12/27/2015, 02/10/2017   Moderna Sars-Covid-2 Vaccination 05/24/2019, 06/22/2019   Pneumococcal Conjugate-13 02/27/2015   Pneumococcal Polysaccharide-23 03/29/2009, 08/06/2016   Td 03/29/2006   Tdap 01/26/2019  Zoster Recombinant(Shingrix) 03/06/2020, 09/09/2020    TDAP status: Up to date  Flu Vaccine status: Due, Education has been provided regarding the importance of this vaccine. Advised may receive this vaccine at local pharmacy or  Health Dept. Aware to provide a copy of the vaccination record if obtained from local pharmacy or Health Dept. Verbalized acceptance and understanding.  Pneumococcal vaccine status: Up to date  Covid-19 vaccine status: Information provided on how to obtain vaccines.   Qualifies for Shingles Vaccine? Yes   Zostavax completed No   Shingrix Completed?: Yes  Screening Tests Health Maintenance  Topic Date Due   COVID-19 Vaccine (3 - Moderna risk series) 07/20/2019   INFLUENZA VACCINE  10/28/2022   Medicare Annual Wellness (AWV)  05/09/2024   Colonoscopy  01/26/2027   DTaP/Tdap/Td (3 - Td or Tdap) 01/25/2029   Pneumonia Vaccine 102+ Years old  Completed   Zoster Vaccines- Shingrix  Completed   HPV VACCINES  Aged Out   Hepatitis C Screening  Discontinued    Health Maintenance  Health Maintenance Due  Topic Date Due   COVID-19 Vaccine (3 - Moderna risk series) 07/20/2019   INFLUENZA VACCINE  10/28/2022    Colorectal cancer screening: Type of screening: Colonoscopy. Completed 01/25/17. Repeat every 10 years  Lung Cancer Screening: (Low Dose CT Chest recommended if Age 75-80 years, 20 pack-year currently smoking OR have quit w/in 15years.) does not qualify.   Lung Cancer Screening Referral: n/a  Additional Screening:  Hepatitis C Screening: does qualify;  Vision Screening: Recommended annual ophthalmology exams for early detection of glaucoma and other disorders of the eye. Is the patient up to date with their annual eye exam?  Yes  Who is the provider or what is the name of the office in which the patient attends annual eye exams? [facility] If pt is not established with a provider, would they like to be referred to a provider to establish care? No .   Dental Screening: Recommended annual dental exams for proper oral hygiene  Community Resource Referral / Chronic Care Management: CRR required this visit?  No   CCM required this visit?  No     Plan:     I have  personally reviewed and noted the following in the patient's chart:   Medical and social history Use of alcohol, tobacco or illicit drugs  Current medications and supplements including opioid prescriptions. Patient is not currently taking opioid prescriptions. Functional ability and status Nutritional status Physical activity Advanced directives List of other physicians Hospitalizations, surgeries, and ER visits in previous 12 months Vitals Screenings to include cognitive, depression, and falls Referrals and appointments  In addition, I have reviewed and discussed with patient certain preventive protocols, quality metrics, and best practice recommendations. A written personalized care plan for preventive services as well as general preventive health recommendations were provided to patient.     Kandis Fantasia [place], [place]   6/96/2952   After Visit Summary: (MyChart) Due to this being a telephonic visit, the after visit summary with patients personalized plan was offered to patient via MyChart   Nurse Notes: No concerns at this time

## 2023-05-25 ENCOUNTER — Telehealth: Payer: Self-pay

## 2023-05-25 NOTE — Telephone Encounter (Signed)
 Future lab orders were placed after pts last OV.

## 2023-05-25 NOTE — Telephone Encounter (Signed)
 Copied from CRM [id number]. Topic: Appointments - Appointment Scheduling >> May 25, 2023 11:06 AM Gery Pray wrote: Patient/patient representative is calling to schedule an appointment. Refer to attachments for appointment information.  Patient scheduled appt for labs on Friday, 02/28 at 9:45 am. Please order labs for appt on 03/03.

## 2023-05-27 ENCOUNTER — Other Ambulatory Visit: Payer: Medicare Other

## 2023-05-27 DIAGNOSIS — Z8546 Personal history of malignant neoplasm of prostate: Secondary | ICD-10-CM

## 2023-05-27 DIAGNOSIS — I6523 Occlusion and stenosis of bilateral carotid arteries: Secondary | ICD-10-CM

## 2023-05-27 DIAGNOSIS — E782 Mixed hyperlipidemia: Secondary | ICD-10-CM

## 2023-05-27 DIAGNOSIS — N400 Enlarged prostate without lower urinary tract symptoms: Secondary | ICD-10-CM

## 2023-05-28 LAB — PSA, TOTAL AND FREE
PSA, Free Pct: 17.5 %
PSA, Free: 0.42 ng/mL
Prostate Specific Ag, Serum: 2.4 ng/mL (ref 0.0–4.0)

## 2023-05-28 LAB — CBC WITH DIFFERENTIAL/PLATELET
Basophils Absolute: 0 10*3/uL (ref 0.0–0.2)
Basos: 1 %
EOS (ABSOLUTE): 0.2 10*3/uL (ref 0.0–0.4)
Eos: 3 %
Hematocrit: 45 % (ref 37.5–51.0)
Hemoglobin: 15.5 g/dL (ref 13.0–17.7)
Immature Grans (Abs): 0 10*3/uL (ref 0.0–0.1)
Immature Granulocytes: 0 %
Lymphocytes Absolute: 1.3 10*3/uL (ref 0.7–3.1)
Lymphs: 22 %
MCH: 31.1 pg (ref 26.6–33.0)
MCHC: 34.4 g/dL (ref 31.5–35.7)
MCV: 90 fL (ref 79–97)
Monocytes Absolute: 0.7 10*3/uL (ref 0.1–0.9)
Monocytes: 11 %
Neutrophils Absolute: 3.8 10*3/uL (ref 1.4–7.0)
Neutrophils: 63 %
Platelets: 214 10*3/uL (ref 150–450)
RBC: 4.98 x10E6/uL (ref 4.14–5.80)
RDW: 12 % (ref 11.6–15.4)
WBC: 5.9 10*3/uL (ref 3.4–10.8)

## 2023-05-28 LAB — CMP14+EGFR
ALT: 21 IU/L (ref 0–44)
AST: 22 IU/L (ref 0–40)
Albumin: 4.3 g/dL (ref 3.8–4.8)
Alkaline Phosphatase: 75 IU/L (ref 44–121)
BUN/Creatinine Ratio: 20 (ref 10–24)
BUN: 20 mg/dL (ref 8–27)
Bilirubin Total: 0.8 mg/dL (ref 0.0–1.2)
CO2: 23 mmol/L (ref 20–29)
Calcium: 9.2 mg/dL (ref 8.6–10.2)
Chloride: 103 mmol/L (ref 96–106)
Creatinine, Ser: 0.99 mg/dL (ref 0.76–1.27)
Globulin, Total: 2.4 g/dL (ref 1.5–4.5)
Glucose: 90 mg/dL (ref 70–99)
Potassium: 4.4 mmol/L (ref 3.5–5.2)
Sodium: 139 mmol/L (ref 134–144)
Total Protein: 6.7 g/dL (ref 6.0–8.5)
eGFR: 80 mL/min/{1.73_m2} (ref >59)

## 2023-05-28 LAB — LIPID PANEL
Chol/HDL Ratio: 2.4 ratio (ref 0.0–5.0)
Cholesterol, Total: 102 mg/dL (ref 100–199)
HDL: 43 mg/dL (ref >39)
LDL Chol Calc (NIH): 47 mg/dL (ref 0–99)
Triglycerides: 49 mg/dL (ref 0–149)
VLDL Cholesterol Cal: 12 mg/dL (ref 5–40)

## 2023-05-30 ENCOUNTER — Encounter: Payer: Self-pay | Admitting: Family Medicine

## 2023-05-30 ENCOUNTER — Ambulatory Visit: Payer: Medicare Other | Admitting: Family Medicine

## 2023-05-30 VITALS — BP 134/70 | HR 67 | Ht 69.0 in | Wt 174.0 lb

## 2023-05-30 DIAGNOSIS — Z0000 Encounter for general adult medical examination without abnormal findings: Secondary | ICD-10-CM

## 2023-05-30 DIAGNOSIS — N400 Enlarged prostate without lower urinary tract symptoms: Secondary | ICD-10-CM

## 2023-05-30 DIAGNOSIS — N529 Male erectile dysfunction, unspecified: Secondary | ICD-10-CM

## 2023-05-30 DIAGNOSIS — Z8546 Personal history of malignant neoplasm of prostate: Secondary | ICD-10-CM | POA: Diagnosis not present

## 2023-05-30 DIAGNOSIS — I6523 Occlusion and stenosis of bilateral carotid arteries: Secondary | ICD-10-CM | POA: Diagnosis not present

## 2023-05-30 DIAGNOSIS — Z0001 Encounter for general adult medical examination with abnormal findings: Secondary | ICD-10-CM

## 2023-05-30 DIAGNOSIS — E782 Mixed hyperlipidemia: Secondary | ICD-10-CM | POA: Diagnosis not present

## 2023-05-30 MED ORDER — ATORVASTATIN CALCIUM 40 MG PO TABS: 40.0000 mg | ORAL_TABLET | Freq: Every day | ORAL | 3 refills | Status: DC

## 2023-05-30 MED ORDER — EZETIMIBE 10 MG PO TABS: 10.0000 mg | ORAL_TABLET | Freq: Every day | ORAL | 3 refills | Status: DC

## 2023-05-30 MED ORDER — SILDENAFIL CITRATE 20 MG PO TABS: 20.0000 mg | ORAL_TABLET | ORAL | 1 refills | Status: DC | PRN

## 2023-05-30 MED ORDER — FINASTERIDE 5 MG PO TABS: 5.0000 mg | ORAL_TABLET | Freq: Every day | ORAL | 3 refills | Status: DC

## 2023-05-30 NOTE — Progress Notes (Signed)
 BP 134/70   Pulse 67   Ht 5\' 9"  (1.753 m)   Wt 174 lb (78.9 kg)   SpO2 98%   BMI 25.70 kg/m    Subjective:   Patient ID: Jesse Brady, male    DOB: 1950/09/22, 73 y.o.   MRN: [id number]  HPI: Jesse Brady is a 73 y.o. male presenting on 05/30/2023 for Medical Management of Chronic Issues (CPE) and Hyperlipidemia   HPI Physical exam Patient denies any chest pain, shortness of breath, headaches or vision issues, abdominal complaints, diarrhea, nausea, vomiting, or joint issues.   Hyperlipidemia and CAD Patient is coming in for recheck of his hyperlipidemia. The patient is currently taking Lipitor and fish oils. They deny any issues with myalgias or history of liver damage from it. They deny any focal numbness or weakness or chest pain.   Prostate hypertrophy with history of cancer Patient is coming in for recheck on prostate hypertrophy Symptoms: Decreased urinary stream Medication: Finasteride Last PSA: Last week, was slightly up at 2.4.  Relevant past medical, surgical, family and social history reviewed and updated as indicated. Interim medical history since our last visit reviewed. Allergies and medications reviewed and updated.  Review of Systems  Constitutional:  Negative for chills and fever.  HENT:  Negative for ear pain and tinnitus.   Eyes:  Negative for pain and discharge.  Respiratory:  Negative for cough, shortness of breath and wheezing.   Cardiovascular:  Negative for chest pain, palpitations and leg swelling.  Gastrointestinal:  Negative for abdominal pain, blood in stool, constipation and diarrhea.  Genitourinary:  Positive for decreased urine volume and difficulty urinating. Negative for dysuria and hematuria.  Musculoskeletal:  Negative for back pain, gait problem and myalgias.  Skin:  Negative for rash.  Neurological:  Negative for dizziness, weakness and headaches.  Psychiatric/Behavioral:  Negative for suicidal ideas.   All other systems  reviewed and are negative.   Per HPI unless specifically indicated above   Allergies as of 05/30/2023   No Known Allergies      Medication List        Accurate as of May 30, 2023 11:07 AM. If you have any questions, ask your nurse or doctor.          aspirin EC 81 MG tablet Take 81 mg by mouth daily.   atorvastatin 40 MG tablet Commonly known as: LIPITOR Take 1 tablet (40 mg total) by mouth daily.   co-enzyme Q-10 30 MG capsule Take 30 mg by mouth daily.   ezetimibe 10 MG tablet Commonly known as: ZETIA Take 1 tablet (10 mg total) by mouth daily.   finasteride 5 MG tablet Commonly known as: PROSCAR Take 1 tablet (5 mg total) by mouth daily.   Fish Oil 1000 MG Cpdr Take 1 capsule by mouth daily.   metroNIDAZOLE 1 % gel Commonly known as: Metrogel Apply topically daily.   multivitamin with minerals Tabs tablet Take 1 tablet by mouth daily.   nitroGLYCERIN 0.4 MG SL tablet Commonly known as: Nitrostat Place 1 tablet (0.4 mg total) under the tongue every 5 (five) minutes as needed for chest pain.   QC Tumeric Complex 500 MG Caps Generic drug: Turmeric Take 500 mg by mouth daily.   sildenafil 20 MG tablet Commonly known as: REVATIO Take 1-3 tablets (20-60 mg total) by mouth as needed.         Objective:   BP 134/70   Pulse 67   Ht 5\' 9"  (1.753  m)   Wt 174 lb (78.9 kg)   SpO2 98%   BMI 25.70 kg/m   Wt Readings from Last 3 Encounters:  05/30/23 174 lb (78.9 kg)  05/10/23 171 lb (77.6 kg)  11/24/22 171 lb (77.6 kg)    Physical Exam Vitals reviewed.  Constitutional:      General: He is not in acute distress.    Appearance: He is well-developed. He is not diaphoretic.  HENT:     Right Ear: External ear normal.     Left Ear: External ear normal.     Nose: Nose normal.     Mouth/Throat:     Pharynx: No oropharyngeal exudate.  Eyes:     General: No scleral icterus.    Conjunctiva/sclera: Conjunctivae normal.  Neck:     Thyroid: No  thyromegaly.  Cardiovascular:     Rate and Rhythm: Normal rate and regular rhythm.     Heart sounds: Normal heart sounds. No murmur heard. Pulmonary:     Effort: Pulmonary effort is normal. No respiratory distress.     Breath sounds: Normal breath sounds. No wheezing.  Abdominal:     General: Bowel sounds are normal. There is no distension.     Palpations: Abdomen is soft.     Tenderness: There is no abdominal tenderness. There is no guarding or rebound.  Musculoskeletal:        General: No swelling. Normal range of motion.     Cervical back: Neck supple.  Lymphadenopathy:     Cervical: No cervical adenopathy.  Skin:    General: Skin is warm and dry.     Findings: No rash.  Neurological:     Mental Status: He is alert and oriented to person, place, and time.     Coordination: Coordination normal.  Psychiatric:        Behavior: Behavior normal.     Results for orders placed or performed in visit on 05/27/23  PSA, total and free   Collection Time: 05/27/23  9:26 AM  Result Value Ref Range   Prostate Specific Ag, Serum 2.4 0.0 - 4.0 ng/mL   PSA, Free 0.42 N/A ng/mL   PSA, Free Pct 17.5 %  CBC with Differential/Platelet   Collection Time: 05/27/23  9:26 AM  Result Value Ref Range   WBC 5.9 3.4 - 10.8 x10E3/uL   RBC 4.98 4.14 - 5.80 x10E6/uL   Hemoglobin 15.5 13.0 - 17.7 g/dL   Hematocrit 16.1 09.6 - 51.0 %   MCV 90 79 - 97 fL   MCH 31.1 26.6 - 33.0 pg   MCHC 34.4 31.5 - 35.7 g/dL   RDW 04.5 40.9 - 81.1 %   Platelets 214 150 - 450 x10E3/uL   Neutrophils 63 Not Estab. %   Lymphs 22 Not Estab. %   Monocytes 11 Not Estab. %   Eos 3 Not Estab. %   Basos 1 Not Estab. %   Neutrophils Absolute 3.8 1.4 - 7.0 x10E3/uL   Lymphocytes Absolute 1.3 0.7 - 3.1 x10E3/uL   Monocytes Absolute 0.7 0.1 - 0.9 x10E3/uL   EOS (ABSOLUTE) 0.2 0.0 - 0.4 x10E3/uL   Basophils Absolute 0.0 0.0 - 0.2 x10E3/uL   Immature Granulocytes 0 Not Estab. %   Immature Grans (Abs) 0.0 0.0 - 0.1 x10E3/uL   CMP14+EGFR   Collection Time: 05/27/23  9:26 AM  Result Value Ref Range   Glucose 90 70 - 99 mg/dL   BUN 20 8 - 27 mg/dL   Creatinine, Ser 9.14  0.76 - 1.27 mg/dL   eGFR 80 >16 XW/RUE/4.54   BUN/Creatinine Ratio 20 10 - 24   Sodium 139 134 - 144 mmol/L   Potassium 4.4 3.5 - 5.2 mmol/L   Chloride 103 96 - 106 mmol/L   CO2 23 20 - 29 mmol/L   Calcium 9.2 8.6 - 10.2 mg/dL   Total Protein 6.7 6.0 - 8.5 g/dL   Albumin 4.3 3.8 - 4.8 g/dL   Globulin, Total 2.4 1.5 - 4.5 g/dL   Bilirubin Total 0.8 0.0 - 1.2 mg/dL   Alkaline Phosphatase 75 44 - 121 IU/L   AST 22 0 - 40 IU/L   ALT 21 0 - 44 IU/L  Lipid panel   Collection Time: 05/27/23  9:26 AM  Result Value Ref Range   Cholesterol, Total 102 100 - 199 mg/dL   Triglycerides 49 0 - 149 mg/dL   HDL 43 >09 mg/dL   VLDL Cholesterol Cal 12 5 - 40 mg/dL   LDL Chol Calc (NIH) 47 0 - 99 mg/dL   Chol/HDL Ratio 2.4 0.0 - 5.0 ratio    Assessment & Plan:   Problem List Items Addressed This Visit       Cardiovascular and Mediastinum   Carotid artery disease (HCC)   Relevant Medications   atorvastatin (LIPITOR) 40 MG tablet   ezetimibe (ZETIA) 10 MG tablet   Other Relevant Orders   CBC with Differential/Platelet   CMP14+EGFR     Other   Hyperlipidemia   Relevant Medications   atorvastatin (LIPITOR) 40 MG tablet   ezetimibe (ZETIA) 10 MG tablet   Other Relevant Orders   CBC with Differential/Platelet   CMP14+EGFR   Lipid panel   Prostate hypertrophy   Relevant Orders   PSA, total and free   History of prostate cancer   Relevant Medications   finasteride (PROSCAR) 5 MG tablet   Other Relevant Orders   PSA, total and free   Other Visit Diagnoses       Physical exam    -  Primary   Relevant Orders   CBC with Differential/Platelet   CMP14+EGFR   Lipid panel   PSA, total and free       Continue current medicine, seems to be doing well.  Has a little bit of decreased urinary stream but does not want to try Flomax  yet.  Has tried sildenafil up to 3 tablets and feels like it helped a little bit, will increase it to up to 5 tablets, he does not take nitro at all but warned him about not using them on the same day   Follow up plan: Return in about 6 months (around 11/30/2023), or if symptoms worsen or fail to improve, for CAD and hyperlipidemia recheck.  Counseling provided for all of the vaccine components Orders Placed This Encounter  Procedures   CBC with Differential/Platelet   CMP14+EGFR   Lipid panel   PSA, total and free    Arville Care, MD [facility] 05/30/2023, 11:07 AM

## 2023-06-29 ENCOUNTER — Encounter: Payer: Self-pay | Admitting: Cardiology

## 2023-06-29 ENCOUNTER — Ambulatory Visit: Attending: Cardiology | Admitting: Cardiology

## 2023-06-29 VITALS — BP 136/68 | HR 63 | Ht 69.0 in | Wt 176.2 lb

## 2023-06-29 DIAGNOSIS — E782 Mixed hyperlipidemia: Secondary | ICD-10-CM

## 2023-06-29 DIAGNOSIS — I25119 Atherosclerotic heart disease of native coronary artery with unspecified angina pectoris: Secondary | ICD-10-CM | POA: Diagnosis not present

## 2023-06-29 NOTE — Patient Instructions (Signed)

## 2023-06-29 NOTE — Progress Notes (Signed)
    Cardiology Office Note  Date: 06/29/2023   ID: MEREK NIU, DOB [birth year], MRN [id number]  History of Present Illness: Jesse Brady is a 73 y.o. male last seen in February 2024.  He is here for a routine visit.  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  Plans to slow down most likely when he turns 73.  He does not report any angina or nitroglycerin use.  We went over his medications today.  Lipids have been well-controlled, LDL 47 in February.  I reviewed his ECG today which shows sinus rhythm with leftward axis and increased voltage.  Physical Exam: VS:  BP 136/68   Pulse 63   Ht 5\' 9"  (1.753 m)   Wt 176 lb 3.2 oz (79.9 kg)   SpO2 96%   BMI 26.02 kg/m , BMI Body mass index is 26.02 kg/m.  Wt Readings from Last 3 Encounters:  06/29/23 176 lb 3.2 oz (79.9 kg)  05/30/23 174 lb (78.9 kg)  05/10/23 171 lb (77.6 kg)    General: Patient appears comfortable at rest. HEENT: Conjunctiva and lids normal. Neck: Supple, no elevated JVP or carotid bruits. Lungs: Clear to auscultation, nonlabored breathing at rest. Cardiac: Regular rate and rhythm, no S3 or significant systolic murmur. Extremities: No pitting edema.  ECG:  An ECG dated 09/30/2021 was personally reviewed today and demonstrated:  Sinus rhythm with increased voltage.  Labwork: 05/27/2023: ALT 21; AST 22; BUN 20; Creatinine, Ser 0.99; Hemoglobin 15.5; Platelets 214; Potassium 4.4; Sodium 139     Component Value Date/Time   CHOL 102 05/27/2023 0926   TRIG 49 05/27/2023 0926   HDL 43 05/27/2023 0926   CHOLHDL 2.4 05/27/2023 0926   CHOLHDL 6.1 03/12/2013 2327   VLDL 59 (H) 03/12/2013 2327   LDLCALC 47 05/27/2023 0926   Other Studies Reviewed Today:  No interval cardiac testing for review today.  Assessment and Plan:  1.  CAD status post DES to the LAD in January 2016.  Exercise Myoview from July 2023 showed no evidence of ischemia and normal LVEF.  He does not  report any angina or interval nitroglycerin use.  ECG reviewed and stable.  Plan to continue observation, we have discussed warning signs and symptoms.  Continue aspirin 81 mg daily, Lipitor 40 mg daily, Zetia 10 mg daily, and as needed nitroglycerin.   2.  Mixed hyperlipidemia.  LDL 47 in February.  He continues on Lipitor 40 mg daily and Zetia 10 mg daily.  Disposition:  Follow up  1 year.  Signed, Jonelle Sidle, M.D., F.A.C.C. [facility]

## 2023-08-08 ENCOUNTER — Ambulatory Visit: Admitting: Cardiology

## 2023-11-23 ENCOUNTER — Other Ambulatory Visit

## 2023-11-23 DIAGNOSIS — N400 Enlarged prostate without lower urinary tract symptoms: Secondary | ICD-10-CM

## 2023-11-23 DIAGNOSIS — I6523 Occlusion and stenosis of bilateral carotid arteries: Secondary | ICD-10-CM

## 2023-11-23 DIAGNOSIS — E782 Mixed hyperlipidemia: Secondary | ICD-10-CM | POA: Diagnosis not present

## 2023-11-23 DIAGNOSIS — Z8546 Personal history of malignant neoplasm of prostate: Secondary | ICD-10-CM

## 2023-11-23 DIAGNOSIS — Z0000 Encounter for general adult medical examination without abnormal findings: Secondary | ICD-10-CM | POA: Diagnosis not present

## 2023-11-23 LAB — LIPID PANEL

## 2023-11-24 LAB — CBC WITH DIFFERENTIAL/PLATELET
Basophils Absolute: 0 x10E3/uL (ref 0.0–0.2)
Basos: 1 %
EOS (ABSOLUTE): 0.2 x10E3/uL (ref 0.0–0.4)
Eos: 4 %
Hematocrit: 47.4 % (ref 37.5–51.0)
Hemoglobin: 15.8 g/dL (ref 13.0–17.7)
Immature Grans (Abs): 0 x10E3/uL (ref 0.0–0.1)
Immature Granulocytes: 0 %
Lymphocytes Absolute: 1.2 x10E3/uL (ref 0.7–3.1)
Lymphs: 22 %
MCH: 30.9 pg (ref 26.6–33.0)
MCHC: 33.3 g/dL (ref 31.5–35.7)
MCV: 93 fL (ref 79–97)
Monocytes Absolute: 0.6 x10E3/uL (ref 0.1–0.9)
Monocytes: 10 %
Neutrophils Absolute: 3.7 x10E3/uL (ref 1.4–7.0)
Neutrophils: 63 %
Platelets: 219 x10E3/uL (ref 150–450)
RBC: 5.11 x10E6/uL (ref 4.14–5.80)
RDW: 12.6 % (ref 11.6–15.4)
WBC: 5.7 x10E3/uL (ref 3.4–10.8)

## 2023-11-24 LAB — CMP14+EGFR
ALT: 24 IU/L (ref 0–44)
AST: 30 IU/L (ref 0–40)
Albumin: 4.5 g/dL (ref 3.8–4.8)
Alkaline Phosphatase: 75 IU/L (ref 44–121)
BUN/Creatinine Ratio: 19 (ref 10–24)
BUN: 21 mg/dL (ref 8–27)
Bilirubin Total: 0.6 mg/dL (ref 0.0–1.2)
CO2: 21 mmol/L (ref 20–29)
Calcium: 9.5 mg/dL (ref 8.6–10.2)
Chloride: 104 mmol/L (ref 96–106)
Creatinine, Ser: 1.13 mg/dL (ref 0.76–1.27)
Globulin, Total: 2.2 g/dL (ref 1.5–4.5)
Glucose: 107 mg/dL — ABNORMAL HIGH (ref 70–99)
Potassium: 4.8 mmol/L (ref 3.5–5.2)
Sodium: 142 mmol/L (ref 134–144)
Total Protein: 6.7 g/dL (ref 6.0–8.5)
eGFR: 68 mL/min/1.73 (ref >59)

## 2023-11-24 LAB — LIPID PANEL
Cholesterol, Total: 111 mg/dL (ref 100–199)
HDL: 46 mg/dL (ref >39)
LDL CALC COMMENT:: 2.4 ratio (ref 0.0–5.0)
LDL Chol Calc (NIH): 53 mg/dL (ref 0–99)
Triglycerides: 48 mg/dL (ref 0–149)
VLDL Cholesterol Cal: 12 mg/dL (ref 5–40)

## 2023-11-24 LAB — PSA, TOTAL AND FREE
PSA, Free Pct: 21.3
PSA, Free: 0.32 ng/mL
Prostate Specific Ag, Serum: 1.5 ng/mL (ref 0.0–4.0)

## 2023-11-30 ENCOUNTER — Ambulatory Visit: Admitting: Family Medicine

## 2023-11-30 ENCOUNTER — Encounter: Payer: Self-pay | Admitting: Family Medicine

## 2023-11-30 VITALS — BP 132/76 | HR 62 | Temp 97.5°F | Ht 69.0 in | Wt 174.0 lb

## 2023-11-30 DIAGNOSIS — E782 Mixed hyperlipidemia: Secondary | ICD-10-CM

## 2023-11-30 DIAGNOSIS — N400 Enlarged prostate without lower urinary tract symptoms: Secondary | ICD-10-CM

## 2023-11-30 DIAGNOSIS — N529 Male erectile dysfunction, unspecified: Secondary | ICD-10-CM

## 2023-11-30 DIAGNOSIS — I6523 Occlusion and stenosis of bilateral carotid arteries: Secondary | ICD-10-CM

## 2023-11-30 DIAGNOSIS — Z8546 Personal history of malignant neoplasm of prostate: Secondary | ICD-10-CM | POA: Diagnosis not present

## 2023-11-30 DIAGNOSIS — Z23 Encounter for immunization: Secondary | ICD-10-CM | POA: Diagnosis not present

## 2023-11-30 MED ORDER — TADALAFIL 10 MG PO TABS: 10.0000 mg | ORAL_TABLET | Freq: Every day | ORAL | 1 refills | Status: AC | PRN

## 2023-11-30 NOTE — Progress Notes (Signed)
 BP 132/76   Pulse 62   Temp (!) 97.5 F (36.4 C)   Ht 5' 9 (1.753 m)   Wt 174 lb (78.9 kg)   SpO2 97%   BMI 25.70 kg/m    Subjective:   Patient ID: Jesse Brady, male    DOB: 09-18-1950, 73 y.o.   MRN: [id number]  HPI: Jesse Brady is a 73 y.o. male presenting on 11/30/2023 for Medical Management of Chronic Issues and Hyperlipidemia   Discussed the use of AI scribe software for clinical note transcription with the patient, who gave verbal consent to proceed.  History of Present Illness   Jesse Brady is a 73 year old male with hyperlipidemia, coronary artery disease, and prostate hypertrophy who presents for a chronic medical visit recheck.  His PSA level has improved to 1.5. He is currently taking finasteride  for prostate hypertrophy and has not used tamsulosin in the past. He describes his urinary flow as 'a little bit slow and stop and start type stuff,' but feels okay for the time being. No significant urinary troubles are noted, but there is a gradual change in flow over the years.  His LDL cholesterol is 53, and triglycerides are noted to be good. He is currently taking Zetia , fish oils, and Lipitor, which appear to be effective in managing his cholesterol levels. He mentions a dietary slip involving increased ice cream consumption, which he believes may have affected his blood sugar levels.  His blood sugar was noted to be borderline at 107, which he attributes to dietary choices, including consuming a large bowl of ice cream the night before the test. He has no history of diabetes.  He is taking turmeric and aspirin  for joint inflammation and reports that he started turmeric on his own. He is unsure if it has been beneficial but notes it is used in cooking.  He mentions using a generic for Viagra  but is not satisfied with its effects and is considering trying a different medication. He acknowledges that his erectile function has changed with age and discusses the  potential impact of hydration and exercise on his condition.  He and his wife often split meals when dining out and eat out frequently, which may contribute to higher intake of fats and sugars. He also notes that he drinks about one bottle of water a day, which may contribute to mild dehydration.          Relevant past medical, surgical, family and social history reviewed and updated as indicated. Interim medical history since our last visit reviewed. Allergies and medications reviewed and updated.  Review of Systems  Constitutional:  Negative for chills and fever.  HENT:  Negative for ear pain and tinnitus.   Eyes:  Negative for pain and discharge.  Respiratory:  Negative for cough, shortness of breath and wheezing.   Cardiovascular:  Negative for chest pain, palpitations and leg swelling.  Gastrointestinal:  Negative for abdominal pain, blood in stool, constipation and diarrhea.  Genitourinary:  Negative for dysuria and hematuria.  Musculoskeletal:  Negative for back pain, gait problem and myalgias.  Skin:  Negative for rash.  Neurological:  Negative for dizziness, weakness and headaches.  Psychiatric/Behavioral:  Negative for suicidal ideas.   All other systems reviewed and are negative.   Per HPI unless specifically indicated above   Allergies as of 11/30/2023   No Known Allergies      Medication List        Accurate as of November 30, 2023  8:22 AM. If you have any questions, ask your nurse or doctor.          STOP taking these medications    sildenafil  20 MG tablet Commonly known as: REVATIO  Stopped by: Fonda LABOR Bethene Hankinson       TAKE these medications    aspirin  EC 81 MG tablet Take 81 mg by mouth daily.   atorvastatin  40 MG tablet Commonly known as: LIPITOR Take 1 tablet (40 mg total) by mouth daily.   co-enzyme Q-10 30 MG capsule Take 30 mg by mouth daily.   ezetimibe  10 MG tablet Commonly known as: ZETIA  Take 1 tablet (10 mg total) by mouth  daily.   finasteride  5 MG tablet Commonly known as: PROSCAR  Take 1 tablet (5 mg total) by mouth daily.   Fish Oil 1000 MG Cpdr Take 1 capsule by mouth daily.   metroNIDAZOLE  1 % gel Commonly known as: Metrogel  Apply topically daily.   multivitamin with minerals Tabs tablet Take 1 tablet by mouth daily.   nitroGLYCERIN  0.4 MG SL tablet Commonly known as: Nitrostat  Place 1 tablet (0.4 mg total) under the tongue every 5 (five) minutes as needed for chest pain.   QC Tumeric Complex 500 MG Caps Generic drug: Turmeric Take 500 mg by mouth daily.   tadalafil  10 MG tablet Commonly known as: CIALIS  Take 1 tablet (10 mg total) by mouth daily as needed for erectile dysfunction. Started by: Fonda LABOR Sheyenne Konz         Objective:   BP 132/76   Pulse 62   Temp (!) 97.5 F (36.4 C)   Ht 5' 9 (1.753 m)   Wt 174 lb (78.9 kg)   SpO2 97%   BMI 25.70 kg/m   Wt Readings from Last 3 Encounters:  11/30/23 174 lb (78.9 kg)  06/29/23 176 lb 3.2 oz (79.9 kg)  05/30/23 174 lb (78.9 kg)    Physical Exam Vitals and nursing note reviewed.  Constitutional:      Appearance: Normal appearance.  Neurological:     Mental Status: He is alert.    Physical Exam   NECK: Thyroid without nodules or enlargement. CHEST: Lungs clear to auscultation bilaterally. CARDIOVASCULAR: Heart regular rate and rhythm.       Results for orders placed or performed in visit on 11/23/23  PSA, total and free   Collection Time: 11/23/23  8:20 AM  Result Value Ref Range   Prostate Specific Ag, Serum 1.5 0.0 - 4.0 ng/mL   PSA, Free 0.32 N/A ng/mL   PSA, Free Pct 21.3 %  Lipid panel   Collection Time: 11/23/23  8:20 AM  Result Value Ref Range   Cholesterol, Total 111 100 - 199 mg/dL   Triglycerides 48 0 - 149 mg/dL   HDL 46 >60 mg/dL   VLDL Cholesterol Cal 12 5 - 40 mg/dL   LDL Chol Calc (NIH) 53 0 - 99 mg/dL   Chol/HDL Ratio 2.4 0.0 - 5.0 ratio  CMP14+EGFR   Collection Time: 11/23/23  8:20 AM   Result Value Ref Range   Glucose 107 (H) 70 - 99 mg/dL   BUN 21 8 - 27 mg/dL   Creatinine, Ser 8.86 0.76 - 1.27 mg/dL   eGFR 68 >40 fO/fpw/8.26   BUN/Creatinine Ratio 19 10 - 24   Sodium 142 134 - 144 mmol/L   Potassium 4.8 3.5 - 5.2 mmol/L   Chloride 104 96 - 106 mmol/L   CO2 21 20 - 29 mmol/L  Calcium  9.5 8.6 - 10.2 mg/dL   Total Protein 6.7 6.0 - 8.5 g/dL   Albumin 4.5 3.8 - 4.8 g/dL   Globulin, Total 2.2 1.5 - 4.5 g/dL   Bilirubin Total 0.6 0.0 - 1.2 mg/dL   Alkaline Phosphatase 75 44 - 121 IU/L   AST 30 0 - 40 IU/L   ALT 24 0 - 44 IU/L  CBC with Differential/Platelet   Collection Time: 11/23/23  8:20 AM  Result Value Ref Range   WBC 5.7 3.4 - 10.8 x10E3/uL   RBC 5.11 4.14 - 5.80 x10E6/uL   Hemoglobin 15.8 13.0 - 17.7 g/dL   Hematocrit 52.5 62.4 - 51.0 %   MCV 93 79 - 97 fL   MCH 30.9 26.6 - 33.0 pg   MCHC 33.3 31.5 - 35.7 g/dL   RDW 87.3 88.3 - 84.5 %   Platelets 219 150 - 450 x10E3/uL   Neutrophils 63 Not Estab. %   Lymphs 22 Not Estab. %   Monocytes 10 Not Estab. %   Eos 4 Not Estab. %   Basos 1 Not Estab. %   Neutrophils Absolute 3.7 1.4 - 7.0 x10E3/uL   Lymphocytes Absolute 1.2 0.7 - 3.1 x10E3/uL   Monocytes Absolute 0.6 0.1 - 0.9 x10E3/uL   EOS (ABSOLUTE) 0.2 0.0 - 0.4 x10E3/uL   Basophils Absolute 0.0 0.0 - 0.2 x10E3/uL   Immature Granulocytes 0 Not Estab. %   Immature Grans (Abs) 0.0 0.0 - 0.1 x10E3/uL    Assessment & Plan:   Problem List Items Addressed This Visit       Cardiovascular and Mediastinum   Carotid artery disease (HCC)   Relevant Medications   tadalafil  (CIALIS ) 10 MG tablet     Other   Hyperlipidemia - Primary   Relevant Medications   tadalafil  (CIALIS ) 10 MG tablet   Other Relevant Orders   CBC with Differential/Platelet   CMP14+EGFR   Lipid panel   Prostate hypertrophy   Relevant Orders   CBC with Differential/Platelet   PSA, total and free   ED (erectile dysfunction)   Relevant Orders   CBC with Differential/Platelet    CMP14+EGFR   History of prostate cancer       Hyperlipidemia LDL cholesterol at 53 mg/dL and triglycerides are well-controlled. Current treatment effective. - Continue Zetia  and fish oils. - Continue Lipitor.  Coronary artery disease, status post stent placement Well-managed post-stent placement in 2016. Current medications effective. - Continue current medications including Zetia , fish oils, and Lipitor.  Benign prostatic hyperplasia with history of prostate cancer PSA stable at 1.5 ng/mL. Mild urinary symptoms managed by finasteride . Discussed potential addition of tamsulosin if symptoms worsen. - Continue finasteride . - Consider adding tamsulosin if urinary symptoms worsen.  Erectile dysfunction Erectile dysfunction possibly related to age-related testosterone decline. Current treatment with generic Viagra  unsatisfactory. Discussed switching to generic Cialis  and emphasized hydration and exercise benefits. - Prescribe generic Cialis . - Increase water intake. - Encourage regular exercise.  Borderline elevated blood glucose Blood glucose at 107 mg/dL. No diabetes history. Discussed dietary modifications to prevent diabetes progression. - Monitor blood glucose levels. - Advise on portion control and reducing carbohydrate intake. - Consider A1c testing if blood glucose levels exceed 120-130 mg/dL.          Follow up plan: Return in about 6 months (around 05/29/2024), or if symptoms worsen or fail to improve, for Physical exam and recheck cholesterol.  Counseling provided for all of the vaccine components Orders Placed This Encounter  Procedures  CBC with Differential/Platelet   CMP14+EGFR   Lipid panel   PSA, total and free    Fonda Levins, MD [facility] 11/30/2023, 8:22 AM

## 2023-12-29 DIAGNOSIS — L812 Freckles: Secondary | ICD-10-CM | POA: Diagnosis not present

## 2023-12-29 DIAGNOSIS — L2089 Other atopic dermatitis: Secondary | ICD-10-CM | POA: Diagnosis not present

## 2023-12-29 DIAGNOSIS — L570 Actinic keratosis: Secondary | ICD-10-CM | POA: Diagnosis not present

## 2023-12-29 DIAGNOSIS — Z85828 Personal history of other malignant neoplasm of skin: Secondary | ICD-10-CM | POA: Diagnosis not present

## 2023-12-29 DIAGNOSIS — L821 Other seborrheic keratosis: Secondary | ICD-10-CM | POA: Diagnosis not present

## 2023-12-29 DIAGNOSIS — D1801 Hemangioma of skin and subcutaneous tissue: Secondary | ICD-10-CM | POA: Diagnosis not present

## 2023-12-29 DIAGNOSIS — Z85820 Personal history of malignant melanoma of skin: Secondary | ICD-10-CM | POA: Diagnosis not present

## 2024-01-16 DIAGNOSIS — H43391 Other vitreous opacities, right eye: Secondary | ICD-10-CM | POA: Diagnosis not present

## 2024-01-16 DIAGNOSIS — H43812 Vitreous degeneration, left eye: Secondary | ICD-10-CM | POA: Diagnosis not present

## 2024-01-16 DIAGNOSIS — H2513 Age-related nuclear cataract, bilateral: Secondary | ICD-10-CM | POA: Diagnosis not present

## 2024-05-02 ENCOUNTER — Telehealth: Payer: Self-pay | Admitting: Family Medicine

## 2024-05-02 DIAGNOSIS — E782 Mixed hyperlipidemia: Secondary | ICD-10-CM

## 2024-05-02 DIAGNOSIS — I6523 Occlusion and stenosis of bilateral carotid arteries: Secondary | ICD-10-CM

## 2024-05-02 DIAGNOSIS — Z8546 Personal history of malignant neoplasm of prostate: Secondary | ICD-10-CM

## 2024-05-02 MED ORDER — NITROGLYCERIN 0.4 MG SL SUBL: 0.4000 mg | SUBLINGUAL_TABLET | SUBLINGUAL | 3 refills | Status: AC | PRN

## 2024-05-02 MED ORDER — EZETIMIBE 10 MG PO TABS: 10.0000 mg | ORAL_TABLET | Freq: Every day | ORAL | 3 refills | Status: AC

## 2024-05-02 MED ORDER — ATORVASTATIN CALCIUM 40 MG PO TABS: 40.0000 mg | ORAL_TABLET | Freq: Every day | ORAL | 3 refills | Status: AC

## 2024-05-02 MED ORDER — FINASTERIDE 5 MG PO TABS: 5.0000 mg | ORAL_TABLET | Freq: Every day | ORAL | 3 refills | Status: AC

## 2024-05-02 NOTE — Telephone Encounter (Signed)
 Copied from CRM #[id number]. Topic: Clinical - Prescription Issue >> May 02, 2024  9:52 AM Ivette P wrote: Reason for CRM: Pt called in because was told that needs to use new pharmacy called [facility] and is an therapist, occupational.   Please follow up with pt [phone]

## 2024-05-02 NOTE — Telephone Encounter (Signed)
 Refills sent to [facility]. Pt aware. No further concerns.

## 2024-05-10 ENCOUNTER — Ambulatory Visit: Payer: Medicare Other

## 2024-07-09 ENCOUNTER — Ambulatory Visit
